# Patient Record
Sex: Female | Born: 1985 | Race: White | Hispanic: No | Marital: Married
Health system: Southern US, Community
[De-identification: ages and names within clinical notes are randomized; demographics above are authoritative.]

## PROBLEM LIST (undated history)

## (undated) DIAGNOSIS — E785 Hyperlipidemia, unspecified: Secondary | ICD-10-CM

## (undated) DIAGNOSIS — I10 Essential (primary) hypertension: Secondary | ICD-10-CM

## (undated) DIAGNOSIS — L719 Rosacea, unspecified: Secondary | ICD-10-CM

## (undated) HISTORY — DX: Hyperlipidemia, unspecified: E78.5

## (undated) HISTORY — DX: Essential (primary) hypertension: I10

## (undated) HISTORY — DX: Rosacea, unspecified: L71.9

---
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
## 2005-04-25 ENCOUNTER — Ambulatory Visit: Payer: Self-pay | Admitting: Family Medicine

## 2005-09-02 ENCOUNTER — Ambulatory Visit: Payer: Self-pay | Admitting: Family Medicine

## 2005-09-24 ENCOUNTER — Ambulatory Visit: Payer: Self-pay | Admitting: Family Medicine

## 2005-10-10 ENCOUNTER — Ambulatory Visit: Payer: Self-pay | Admitting: Family Medicine

## 2005-10-15 ENCOUNTER — Ambulatory Visit: Payer: Self-pay | Admitting: Family Medicine

## 2005-10-22 ENCOUNTER — Ambulatory Visit: Payer: Self-pay | Admitting: Family Medicine

## 2005-11-04 ENCOUNTER — Ambulatory Visit: Payer: Self-pay | Admitting: Family Medicine

## 2005-12-02 ENCOUNTER — Ambulatory Visit: Payer: Self-pay | Admitting: Family Medicine

## 2006-01-07 ENCOUNTER — Ambulatory Visit: Payer: Self-pay | Admitting: *Deleted

## 2006-01-31 ENCOUNTER — Ambulatory Visit: Payer: Self-pay | Admitting: *Deleted

## 2006-02-25 ENCOUNTER — Ambulatory Visit: Payer: Self-pay | Admitting: Family Medicine

## 2006-05-16 HISTORY — PX: TONSILLECTOMY: SUR1361

## 2007-04-06 ENCOUNTER — Ambulatory Visit (HOSPITAL_COMMUNITY): Admission: RE | Admit: 2007-04-06 | Discharge: 2007-04-06 | Payer: Self-pay | Admitting: Neurology

## 2007-04-29 ENCOUNTER — Ambulatory Visit (HOSPITAL_COMMUNITY): Admission: RE | Admit: 2007-04-29 | Discharge: 2007-04-29 | Payer: Self-pay | Admitting: Neurology

## 2010-12-28 ENCOUNTER — Ambulatory Visit (HOSPITAL_COMMUNITY)
Admission: RE | Admit: 2010-12-28 | Discharge: 2010-12-28 | Payer: Self-pay | Source: Home / Self Care | Attending: Obstetrics and Gynecology | Admitting: Obstetrics and Gynecology

## 2011-04-30 NOTE — Procedures (Signed)
EEG NUMBER:  [id number].   HISTORY:  This is a 25 year old with syncope who is having EEG done to  evaluate for seizure activity.   PROCEDURE:  This is a sleep-deprived EEG.   TECHNICAL DESCRIPTION:  Throughout this sleep-deprived EEG, there is a  posterior dominant rhythm of 10 Hz activity at 30-45 microvolts.  The  background activity is symmetric, mostly comprised of alpha range  activity at 35-50 microvolts.  With photic stimulation, there is  symmetric photic driving response.  Hyperventilation produces a mild  symmetric slowing response.  The patient does fall asleep during this  recording.  Throughout the circuit, there is no evidence of  electrographic seizures or interictal discharge activity.  The EKG  tracing shows a heart rate of 55-60 beats per minute.   IMPRESSION:  This routine EEG is within normal limits in the awake and  sleep states.  EKG tracing shows heart rate around 55-60 beats per  minute.      Bevelyn Buckles. Nash Shearer, M.D.  Electronically Signed     [id number]  D:  04/29/2007 12:30:02  T:  04/29/2007 13:25:12  Job #:  [id number]

## 2011-05-03 NOTE — Procedures (Signed)
EEG #[id number]   CLINICAL HISTORY:  This is a sleep-deprived EEG done with photic  stimulation and hyperventilation.  The patient is described as awake and  drowsy.  This is a 25 year old woman with multiple episodes of passing  out.  EEG was performed for evaluation of possible seizure.   DESCRIPTION:  The dominant rhythm of this tracing is a moderate-to-high  amplitude alpha rhythm of 10 Hz which predominates posteriorly, appears  without abnormal asymmetry and attenuates with eye opening and closing.  Low amplitude fast activity is seen frontally and centrally and appears  without abnormal asymmetry.  No focal slowing is noted.  No epileptiform  discharges are seen.  The patient appeared to remain in the awake state  throughout the recording.  Photic stimulation produced symmetric driving  responses.  Hyperventilation produced no significant change in the  background rhythms.  Single channel devoted to EKG revealed sinus rhythm  throughout with a rate of approximately 72 beats per minute.   CONCLUSION:  Normal study in the awake state.  Although this study was  performed with sleep deprivation, the patient remained in the awake  state throughout the recording.      Michael L. Thad Ranger, M.D.  Electronically Signed     [id number]  D:  04/06/2007 20:16:34  T:  04/07/2007 08:30:39  Job #:  [id number]

## 2019-11-15 DIAGNOSIS — I69954 Hemiplegia and hemiparesis following unspecified cerebrovascular disease affecting left non-dominant side: Secondary | ICD-10-CM

## 2019-11-15 DIAGNOSIS — G8194 Hemiplegia, unspecified affecting left nondominant side: Secondary | ICD-10-CM

## 2020-05-19 DIAGNOSIS — G43009 Migraine without aura, not intractable, without status migrainosus: Secondary | ICD-10-CM | POA: Insufficient documentation

## 2020-05-19 DIAGNOSIS — G4709 Other insomnia: Secondary | ICD-10-CM | POA: Insufficient documentation

## 2020-05-19 DIAGNOSIS — E66813 Obesity, class 3: Secondary | ICD-10-CM | POA: Insufficient documentation

## 2020-05-19 DIAGNOSIS — F419 Anxiety disorder, unspecified: Secondary | ICD-10-CM | POA: Insufficient documentation

## 2020-10-26 ENCOUNTER — Emergency Department (HOSPITAL_COMMUNITY)
Admission: EM | Admit: 2020-10-26 | Discharge: 2020-10-26 | Disposition: A | Discharge status: Home / Self Care | Payer: 59 | Attending: Emergency Medicine | Admitting: Emergency Medicine

## 2020-10-26 ENCOUNTER — Emergency Department (HOSPITAL_COMMUNITY): Payer: 59

## 2020-10-26 ENCOUNTER — Other Ambulatory Visit: Payer: Self-pay

## 2020-10-26 ENCOUNTER — Encounter (HOSPITAL_COMMUNITY): Payer: Self-pay

## 2020-10-26 DIAGNOSIS — H469 Unspecified optic neuritis: Secondary | ICD-10-CM | POA: Diagnosis not present

## 2020-10-26 DIAGNOSIS — H5461 Unqualified visual loss, right eye, normal vision left eye: Secondary | ICD-10-CM | POA: Diagnosis not present

## 2020-10-26 DIAGNOSIS — G43019 Migraine without aura, intractable, without status migrainosus: Secondary | ICD-10-CM | POA: Diagnosis not present

## 2020-10-26 DIAGNOSIS — H538 Other visual disturbances: Secondary | ICD-10-CM | POA: Diagnosis present

## 2020-10-26 LAB — BASIC METABOLIC PANEL
Anion gap: 11 (ref 5–15)
BUN: 13 mg/dL (ref 6–20)
CO2: 26 mmol/L (ref 22–32)
Calcium: 9.7 mg/dL (ref 8.9–10.3)
Chloride: 104 mmol/L (ref 98–111)
Creatinine, Ser: 0.79 mg/dL (ref 0.44–1.00)
GFR, Estimated: >60 mL/min (ref >60)
Glucose, Bld: 99 mg/dL (ref 70–99)
Potassium: 3.8 mmol/L (ref 3.5–5.1)
Sodium: 141 mmol/L (ref 135–145)

## 2020-10-26 LAB — I-STAT CHEM 8, ED
BUN: 15 mg/dL (ref 6–20)
Calcium, Ion: 1.18 mmol/L (ref 1.15–1.40)
Chloride: 104 mmol/L (ref 98–111)
Creatinine, Ser: 0.8 mg/dL (ref 0.44–1.00)
Glucose, Bld: 98 mg/dL (ref 70–99)
HCT: 42 % (ref 36.0–46.0)
Hemoglobin: 14.3 g/dL (ref 12.0–15.0)
Potassium: 3.7 mmol/L (ref 3.5–5.1)
Sodium: 143 mmol/L (ref 135–145)
TCO2: 28 mmol/L (ref 22–32)

## 2020-10-26 LAB — CBC
HCT: 43.1 % (ref 36.0–46.0)
Hemoglobin: 14 g/dL (ref 12.0–15.0)
MCH: 29.7 pg (ref 26.0–34.0)
MCHC: 32.5 g/dL (ref 30.0–36.0)
MCV: 91.3 fL (ref 80.0–100.0)
Platelets: 238 10*3/uL (ref 150–400)
RBC: 4.72 MIL/uL (ref 3.87–5.11)
RDW: 13.3 % (ref 11.5–15.5)
WBC: 9.3 10*3/uL (ref 4.0–10.5)
nRBC: 0 % (ref 0.0–0.2)

## 2020-10-26 LAB — I-STAT BETA HCG BLOOD, ED (MC, WL, AP ONLY): I-stat hCG, quantitative: <5 m[IU]/mL (ref <5)

## 2020-10-26 MED ORDER — LORAZEPAM 2 MG/ML IJ SOLN
1.0000 mg | Freq: Once | INTRAMUSCULAR | Status: AC
  Administered 2020-10-26: 1 mg via INTRAVENOUS
  Filled 2020-10-26: qty 1

## 2020-10-26 MED ORDER — GADOBUTROL 1 MMOL/ML IV SOLN
8.0000 mL | Freq: Once | INTRAVENOUS | Status: AC | PRN
  Administered 2020-10-26: 8 mL via INTRAVENOUS

## 2020-10-26 NOTE — ED Notes (Signed)
Pt still in MRI, mother remains at bedside and was updated of same

## 2020-10-26 NOTE — ED Notes (Signed)
MRI called and reports pt is claustrophobic and needs something for same, advised them to bring patient back due to nothing being ordered. Provider messaged regarding same.

## 2020-10-26 NOTE — ED Notes (Signed)
Patient back from MRI.

## 2020-10-26 NOTE — ED Provider Notes (Signed)
[facility] EMERGENCY DEPARTMENT Provider Note   CSN: [id number] Arrival date & time: 10/26/20  1417     History Chief Complaint  Patient presents with  . Visual Field Change    DALAYSIA Wagner is a 34 y.o. female.  HPI      This AM at 86AM had blurred vision right eye Thought it was contact at first [facility] to urgent caRE, went to eye doctor around 930AM Can see out of center but is blurry and faded, darkened, Then on the sides of vision above, below and sides all black.   Can see movement and light but not colors, or shapes/letters  Mild pain slight above it dull ache  No other neuro symptoms Denies numbness, weakness, difficulty talking or walking, other visual changes or facial droop.  No headache  Wears contacts, saw Dr. Ernestine Mcmurray OD at [facility]  MRI head orbits, CBC, ESR, CRP, ACEi Lyme FTA ABS RPR VDRL CXR  20/400, IOP 17 and 19 Macula WNL   Hx of complex migraine in the past and was admitted for stroke work up.    History reviewed. No pertinent past medical history.  There are no problems to display for this patient.   History reviewed. No pertinent surgical history.   OB History   No obstetric history on file.     No family history on file.  Social History   Tobacco Use  . Smoking status: Not on file  Substance Use Topics  . Alcohol use: Not on file  . Drug use: Not on file    Home Medications Prior to Admission medications   Not on File    Allergies    Patient has no allergy information on record.  Review of Systems   Review of Systems  Constitutional: Negative for fever.  HENT: Negative for sore throat.   Eyes: Positive for visual disturbance. Negative for pain (slight pain above eye), discharge and redness.  Respiratory: Negative for cough and shortness of breath.   Cardiovascular: Negative for chest pain.  Gastrointestinal: Negative for abdominal pain, nausea and vomiting.  Genitourinary:  Negative for difficulty urinating.  Musculoskeletal: Negative for back pain and neck pain.  Skin: Negative for rash.  Neurological: Negative for syncope and headaches.    Physical Exam Updated Vital Signs BP 102/67   Pulse 94   Temp 98.3 F (36.8 C) (Oral)   Resp 18   Ht 5' 3"  (1.6 m)   Wt 83.5 kg   SpO2 93%   BMI 32.59 kg/m   Physical Exam Vitals and nursing note reviewed.  Constitutional:      General: She is not in acute distress.    Appearance: Normal appearance. She is well-developed. She is not ill-appearing or diaphoretic.  HENT:     Head: Normocephalic and atraumatic.  Eyes:     General: No visual field deficit.    Extraocular Movements: Extraocular movements intact.     Conjunctiva/sclera: Conjunctivae normal.     Comments: anisocoria  Cardiovascular:     Rate and Rhythm: Normal rate and regular rhythm.     Pulses: Normal pulses.     Heart sounds: Normal heart sounds. No murmur heard.  No friction rub. No gallop.   Pulmonary:     Effort: Pulmonary effort is normal. No respiratory distress.     Breath sounds: Normal breath sounds. No wheezing or rales.  Abdominal:     General: There is no distension.     Palpations: Abdomen  is soft.     Tenderness: There is no abdominal tenderness. There is no guarding.  Musculoskeletal:        General: No swelling or tenderness.     Cervical back: Normal range of motion.  Skin:    General: Skin is warm and dry.     Findings: No erythema or rash.  Neurological:     General: No focal deficit present.     Mental Status: She is alert and oriented to person, place, and time.     GCS: GCS eye subscore is 4. GCS verbal subscore is 5. GCS motor subscore is 6.     Cranial Nerves: No cranial nerve deficit, dysarthria or facial asymmetry.     Sensory: No sensory deficit.     Motor: No weakness or tremor.     Coordination: Coordination normal. Finger-Nose-Finger Test normal.     Gait: Gait normal.     Comments: Pronator drift  left UE, slight LLE drift, slight left facial droop, weakness with flexion and extension of left arm Poor vision all visual fields right eye.  Normal visual fields left eye     ED Results / Procedures / Treatments   Labs (all labs ordered are listed, but only abnormal results are displayed) Labs Reviewed  CBC  BASIC METABOLIC PANEL  I-STAT CHEM 8, ED  CBG MONITORING, ED  I-STAT BETA HCG BLOOD, ED ([facility], [facility], [facility] ONLY)    EKG None  Radiology CT HEAD WO CONTRAST  Result Date: 10/26/2020 CLINICAL DATA:  Monocular vision loss EXAM: CT HEAD WITHOUT CONTRAST TECHNIQUE: Contiguous axial images were obtained from the base of the skull through the vertex without intravenous contrast. COMPARISON:  11/15/2019 FINDINGS: Brain: No acute infarct or hemorrhage. Lateral ventricles and midline structures are unremarkable. No acute extra-axial fluid collections. No mass effect. Vascular: No hyperdense vessel or unexpected calcification. Skull: Normal. Negative for fracture or focal lesion. Sinuses/Orbits: No acute finding. Other: None. IMPRESSION: 1. Stable head CT, no acute process. Electronically Signed   By: Randa Ngo M.D.   On: 10/26/2020 15:48   MR Brain W and Wo Contrast  Result Date: 10/26/2020 CLINICAL DATA:  Optic neuritis. EXAM: MRI HEAD AND ORBITS WITHOUT AND WITH CONTRAST TECHNIQUE: Multiplanar, multiecho pulse sequences of the brain and surrounding structures were obtained without and with intravenous contrast. Multiplanar, multiecho pulse sequences of the orbits and surrounding structures were obtained including fat saturation techniques, before and after intravenous contrast administration. CONTRAST:  73m GADAVIST GADOBUTROL 1 MMOL/ML IV SOLN COMPARISON:  Head CT October 26, 2020. FINDINGS: MRI HEAD FINDINGS Brain: No acute infarction, hemorrhage, hydrocephalus, extra-axial collection or mass lesion. The brain parenchyma has normal morphology and signal characteristics. No abnormalities  involving the optic radiations or occipital lobes. Vascular: Normal flow voids. Skull and upper cervical spine: Enlarged left Meckel's cave, suggesting incidental small meningocele. Visualized sinuses: Clear. Other: None. MRI ORBITS FINDINGS ORBITS: Globes: Normal. Lacrimal glands: Normal. Retrobulbar fat: No mass or inflammatory change. Extraocular muscles: Normal bulk and signal intensity. Optic pathway: Optic nerves, chiasm, and tracts are normal. Cavernous sinuses: Normal. Cranial nerves and brainstem: Normal. No abnormal enhancement. IMPRESSION: Unremarkable MRI of the brain and orbits with and without contrast. Specifically, no evidence of optic neuritis. Electronically Signed   By: KPedro EarlsM.D.   On: 10/26/2020 22:36   MR ORBITS W WO CONTRAST  Result Date: 10/26/2020 CLINICAL DATA:  Optic neuritis. EXAM: MRI HEAD AND ORBITS WITHOUT AND WITH CONTRAST TECHNIQUE: Multiplanar, multiecho pulse  sequences of the brain and surrounding structures were obtained without and with intravenous contrast. Multiplanar, multiecho pulse sequences of the orbits and surrounding structures were obtained including fat saturation techniques, before and after intravenous contrast administration. CONTRAST:  48m GADAVIST GADOBUTROL 1 MMOL/ML IV SOLN COMPARISON:  Head CT October 26, 2020. FINDINGS: MRI HEAD FINDINGS Brain: No acute infarction, hemorrhage, hydrocephalus, extra-axial collection or mass lesion. The brain parenchyma has normal morphology and signal characteristics. No abnormalities involving the optic radiations or occipital lobes. Vascular: Normal flow voids. Skull and upper cervical spine: Enlarged left Meckel's cave, suggesting incidental small meningocele. Visualized sinuses: Clear. Other: None. MRI ORBITS FINDINGS ORBITS: Globes: Normal. Lacrimal glands: Normal. Retrobulbar fat: No mass or inflammatory change. Extraocular muscles: Normal bulk and signal intensity. Optic pathway: Optic  nerves, chiasm, and tracts are normal. Cavernous sinuses: Normal. Cranial nerves and brainstem: Normal. No abnormal enhancement. IMPRESSION: Unremarkable MRI of the brain and orbits with and without contrast. Specifically, no evidence of optic neuritis. Electronically Signed   By: KPedro EarlsM.D.   On: 10/26/2020 22:36    Procedures Procedures (including critical care time)  Medications Ordered in ED Medications  LORazepam (ATIVAN) injection 1 mg (1 mg Intravenous Given 10/26/20 2108)  gadobutrol (GADAVIST) 1 MMOL/ML injection 8 mL (8 mLs Intravenous Contrast Given 10/26/20 2154)    ED Course  I have reviewed the triage vital signs and the nursing notes.  Pertinent labs & imaging results that were available during my care of the patient were reviewed by me and considered in my medical decision making (see chart for details).    MDM Rules/Calculators/A&P                          89yofemale with history of complex migraine presents with concern for painless right vision loss at request of optometrist.  Given left sided weakness on my exam, emergently consulted Neurology to evaluate for possible Code Stroke although no clear sign of visual field loss/aphasia/neglect.  Dr. CTheda Serscame to bedside for evaluation and recommends MRI brain WWO and orbits and discussed possibility of ocular migraine.  Optometrist did not have concerns on exam to suggest CRAO, CRVO.  (also no signs of glaucoma, corneal abrasion).  Called Dr. SManuella Ghaziof Ophthalmology--discussed with patient to call in AM for evaluation. 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   Final Clinical Impression(s) / ED Diagnoses Final diagnoses:  Vision loss, right eye    Rx / DC Orders ED Discharge Orders    None       SGareth Morgan MD 10/27/20 1201

## 2020-10-26 NOTE — ED Triage Notes (Addendum)
Pt arrives to ED w/ c/o blurred vision in R eye that started at 0730 this morning. Pt otherwise neuro intact. Pt sent by eye doctor for eval, concern for optic neuritis per paperwork. Pt has hx of complex migraines w/ associated neuro symptoms, however, pt denies headache today.

## 2020-10-26 NOTE — ED Notes (Signed)
Patient medicated and is now ready for MRI. Remains in MRI bay.

## 2020-10-26 NOTE — ED Notes (Signed)
Patient updated on plan of care, denies issues with MRI in the past.

## 2020-10-26 NOTE — ED Notes (Signed)
ED Provider at bedside. 

## 2020-10-26 NOTE — ED Notes (Addendum)
This RN to MRI to administer meds, but they already scheduled someone in front of patient. Spoke with MRI tech, will keep pt in MRI and scan next. Will call this RN when ready for scan. Transport canceled. Pt updated on same and verbalized understanding of same, she is resting comfortably and denies further needs at this time.

## 2020-10-26 NOTE — Consult Note (Addendum)
Neurology Consult H&P  CC: acute visual loss  History is obtained from: patient, ophthalmology note, mother, Dr. Dalene Seltzer.  HPI: Theresa Wagner is a 34 y.o. female right handed PMHx migraine presenting for acute monocular right visual loss. Onset 0730 with blurred vision to near and far. At first she thought it was her contact at first and she went to caRE and subsequently sent to ophtalmology ~0930 and was sent to ED for imaging of orbits to rule optic neuritis.  Can see out of center but is blurry and faded, darkened along the periphery. Then on the sides of vision above, below and sides all black. She can see movement and light but not colors, or shapes/letters. No pain on eye movement and transient mild supraorbital pain which was dull ache.   No other neuro symptoms denies numbness, weakness, difficulty talking or walking, other visual changes or facial droop.   Last night she felt onset of her typical migraine (left frontotemporal sharp throbbing slowly ramping up) and immediately took her triptan and aborted.  ROS: A complete ROS was performed and is negative except as noted in the HPI.   PMHx migraine  FHx: father, uncles, brother - migraines.  Social History: Denies EtOH, Tob/illicits  Exam: Current vital signs: BP (!) 142/97   Pulse 86   Temp 98.4 F (36.9 C) (Oral)   Resp (!) 24   Ht 5\' 3"  (1.6 m)   Wt 83.5 kg   SpO2 99%   BMI 32.59 kg/m   Physical Exam  Constitutional: Appears well-developed and well-nourished.  Psych: Affect appropriate to situation Eyes: No scleral injection HENT: No OP obstrucion Head: Normocephalic.  Cardiovascular: Normal rate and regular rhythm.  Respiratory: Effort normal and breath sounds normal to anterior ascultation GI: Soft.  No distension. There is no tenderness.  Skin: WDI  Neuro: Mental Status: Patient is awake, alert, oriented to person, place, month, year, and situation. Patient is able to give a clear and coherent  history. No signs of aphasia or neglect. Cranial Nerves: II: Visual Fields OD only to motion, absent color. Pupils OD 6-->66mm/OS 4-->2, round, and reactive to light. Fundus: clear crisp no pallor/cupping. c/d ~3-4 b/l III,IV, VI: EOMI without ptosis or diploplia.  V: Facial sensation is symmetric to temperature VII: Facial movement is symmetric.  VIII: hearing is intact to voice X: Uvula elevates symmetrically XI: Shoulder shrug is symmetric. XII: tongue is midline without atrophy or fasciculations.  Motor: Tone is normal. Bulk is normal. 5/5 strength was present in all four extremities. Sensory: Sensation is symmetric to light touch and temperature in the arms and legs. Deep Tendon Reflexes: 2+ and symmetric in the biceps and patellae. Plantars: Toes are downgoing bilaterally. Cerebellar: FNF and HKS are intact bilaterally.  I have reviewed the images obtained: NCT head showed no acute process.  Assessment: LEYLANY Wagner is a 34 y.o. female PMHx migraine headaches with acute monocular right visual loss with anisocoria.  Anisocoria similar in light and dark with reactive pupils normal EOMs, painless without ptosis and loss of color vision. As retina was normal on fundoscopy, constellation of symptoms suggest posterior chamber to posterior segment of the optic nerve. Differential diagnosis includes occular migraine, optic neuritis/multiple sclerosis.  Plan: - MRI brain wo/w contrast emphasis on orbits. - If suggestive of optic neuritis/multiple sclerosis consider methylprednisolone 1,000mg  daily for 5 days. - Ocular migraine by def is painless and typically resolves without treatment.   Electronically signed by: Dr. 32 Pager: [phone] 10/26/2020,  7:32 PM

## 2020-10-26 NOTE — ED Notes (Signed)
Patient still in MRI, unable to get vitals due to same

## 2022-03-07 IMAGING — MR MR HEAD WO/W CM
24 of 26 series · 42 of 48 positions shown · IV contrast (gadavist)
Comparison: Head CT October 26, 2020.

CLINICAL DATA: Optic neuritis.

EXAM:
MRI HEAD AND ORBITS WITHOUT AND WITH CONTRAST
TECHNIQUE: Multiplanar, multiecho pulse sequences of the brain and surrounding
structures were obtained without and with intravenous contrast.
Multiplanar, multiecho pulse sequences of the orbits and surrounding
structures were obtained including fat saturation techniques, before
and after intravenous contrast administration.
CONTRAST:  8mL GADAVIST GADOBUTROL 1 MMOL/ML IV SOLN

[Series 9: DWI · axial · 3.0mm · 0.88mm/px · z∈[-133,+13]mm · 6 of 100 slices shown (1 of 4)]
[im 1/100]
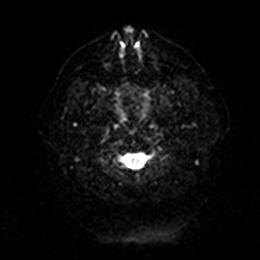
[im 20/100]
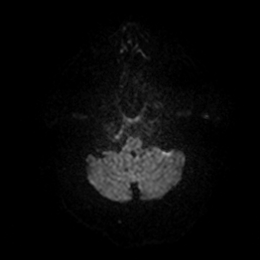
[im 40/100]
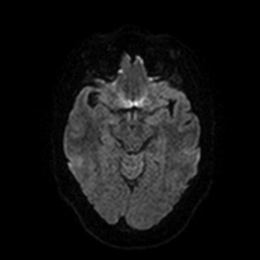
[im 60/100]
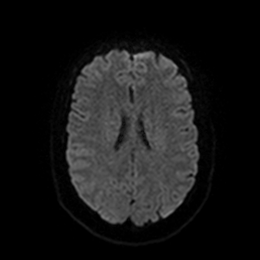
[im 80/100]
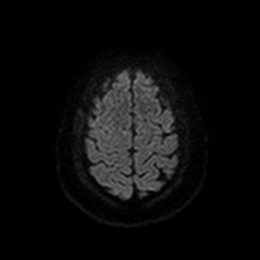
[im 100/100]
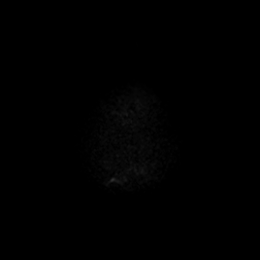

[Series 10: DWI · axial · 3.0mm · 0.88mm/px · z∈[-133,+13]mm · 3 of 50 slices shown (2 of 4)]
[im 1/50]
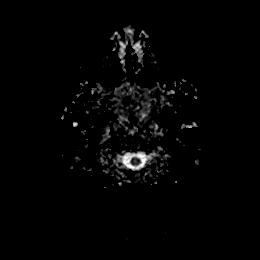
[im 25/50]
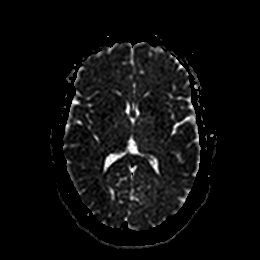
[im 50/50]
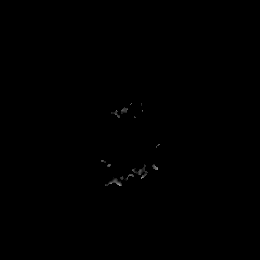

[Series 11: DWI · coronal · 4.0mm · 0.88mm/px · 4 of 72 slices shown (3 of 4)]
[im 1/72]
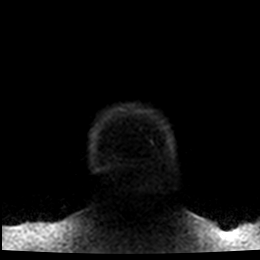
[im 24/72]
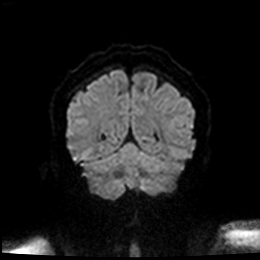
[im 48/72]
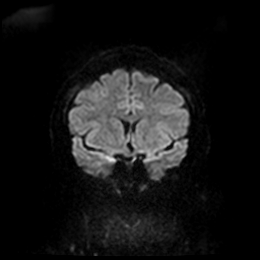
[im 72/72]
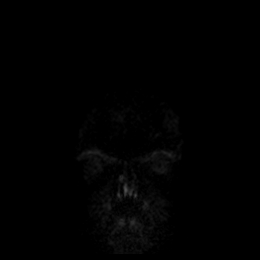

[Series 12: DWI · coronal · 4.0mm · 0.88mm/px · 2 of 36 slices shown (4 of 4)]
[im 1/36]
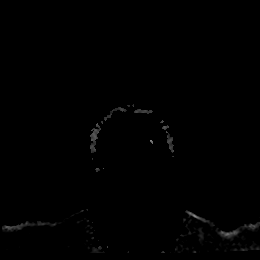
[im 36/36]
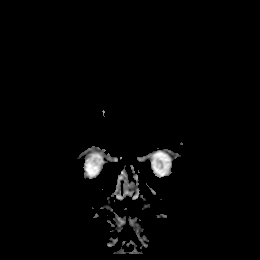

[Series 13: FLAIR · axial · 5.0mm · 0.45mm/px · 1 of 25 slices shown]
[im 1/25]
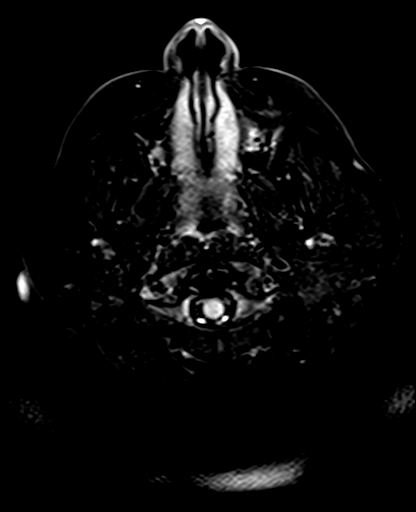

[Series 14: T1 · sagittal · 5.0mm · 0.75mm/px · 1 of 23 slices shown (1 of 3)]
[im 1/23]
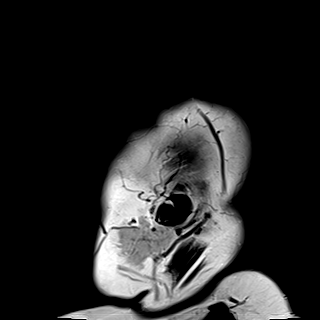

[Series 15: T2 · axial · 5.0mm · 0.72mm/px · 1 of 25 slices shown]
[im 1/25]
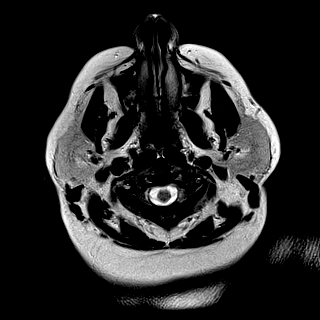

[Series 16: mag_images · axial · 3.0mm · 0.90mm/px · z∈[-143,+10]mm · 3 of 52 slices shown]
[im 1/52]
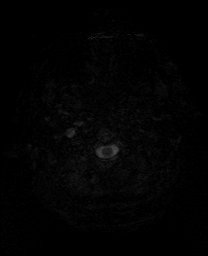
[im 26/52]
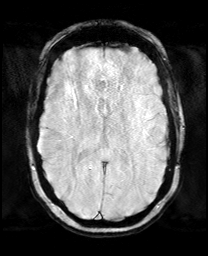
[im 52/52]
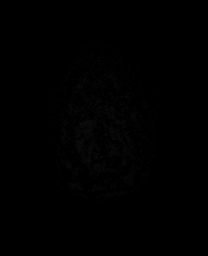

[Series 17: pha_images · axial · 3.0mm · 0.90mm/px · z∈[-143,+10]mm · 3 of 52 slices shown]
[im 1/52]
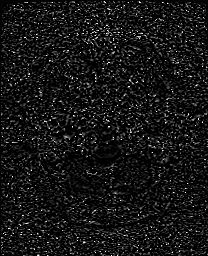
[im 26/52]
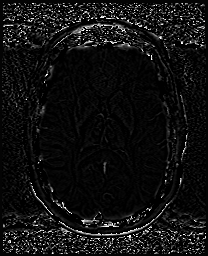
[im 52/52]
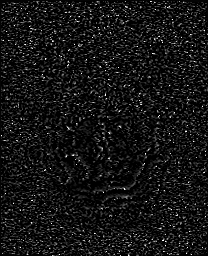

[Series 18: swi_images · axial · 3.0mm · 0.90mm/px · z∈[-143,+10]mm · 3 of 52 slices shown]
[im 1/52]
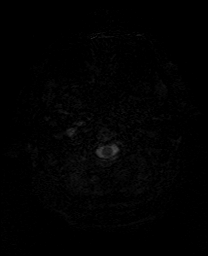
[im 26/52]
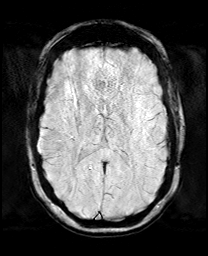
[im 52/52]
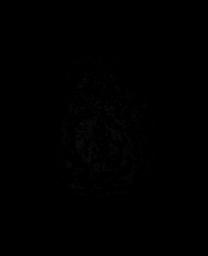

[Series 19: mip_images(sw) · axial · 24.0mm · 0.90mm/px · z∈[-132,-0]mm · 2 of 45 slices shown]
[im 1/45]
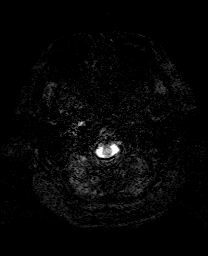
[im 45/45]
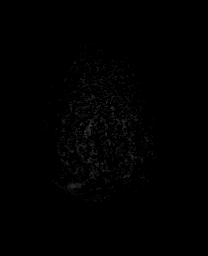

[Series 21: T1 · axial · non-contrast · 3.0mm · 0.37mm/px · 1 of 15 slices shown (2 of 3)]
[im 1/15]
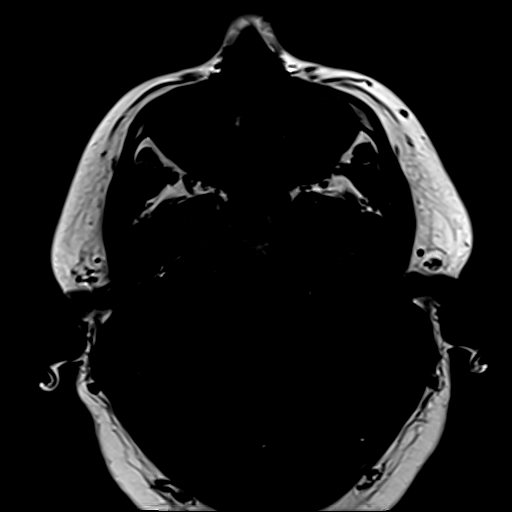

[Series 22: T2 fat-sat · axial · 3.0mm · 0.54mm/px · 1 of 15 slices shown (1 of 6)]
[im 1/15]
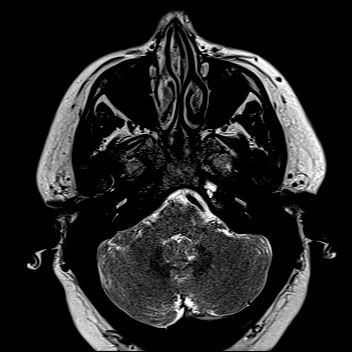

[Series 23: T2 fat-sat · axial · 3.0mm · 0.54mm/px · 1 of 15 slices shown (2 of 6)]
[im 1/15]
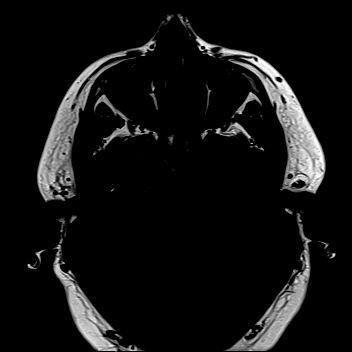

[Series 24: T2 fat-sat · axial · 3.0mm · 0.54mm/px · 1 of 15 slices shown (3 of 6)]
[im 1/15]
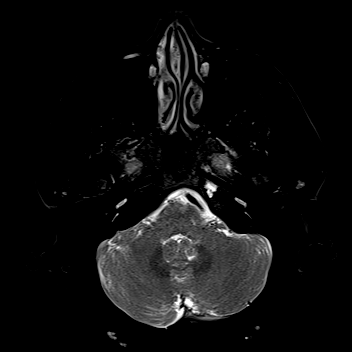

[Series 25: T2 fat-sat · coronal · 3.0mm · 0.54mm/px · 1 of 28 slices shown (4 of 6)]
[im 1/28]
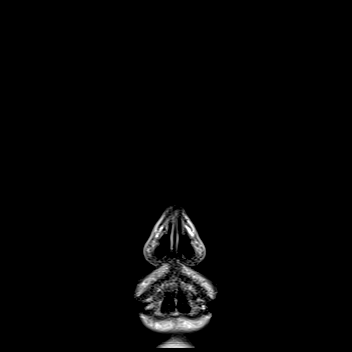

[Series 26: T2 fat-sat · coronal · 3.0mm · 0.54mm/px · 1 of 28 slices shown (5 of 6)]
[im 1/28]
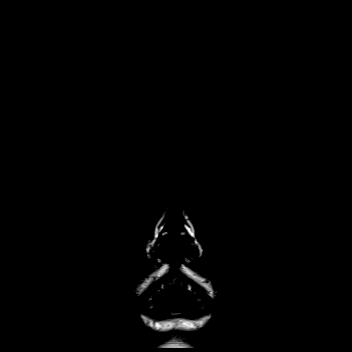

[Series 27: T2 fat-sat · coronal · 3.0mm · 0.54mm/px · 1 of 28 slices shown (6 of 6)]
[im 1/28]
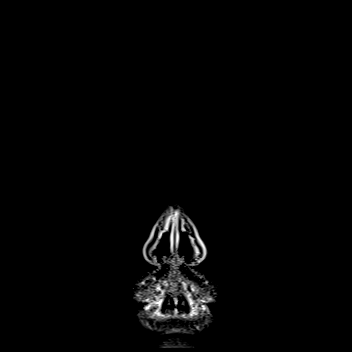

[Series 28: T1 · coronal · 3.0mm · 0.37mm/px · 1 of 28 slices shown (3 of 3)]
[im 1/28]
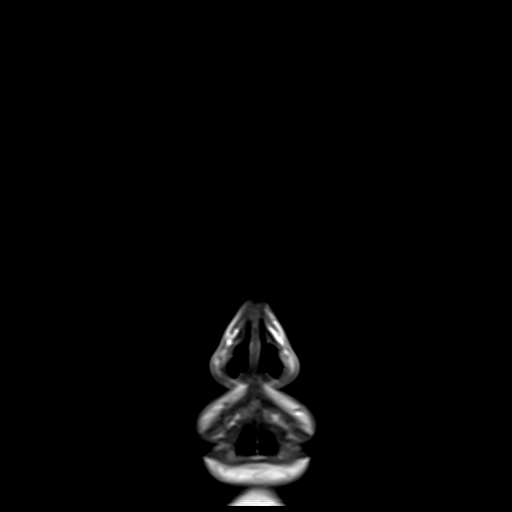

[Series 29: T1 fat-sat post-contrast · axial · 3.0mm · 0.37mm/px · 1 of 15 slices shown (1 of 2)]
[im 1/15]
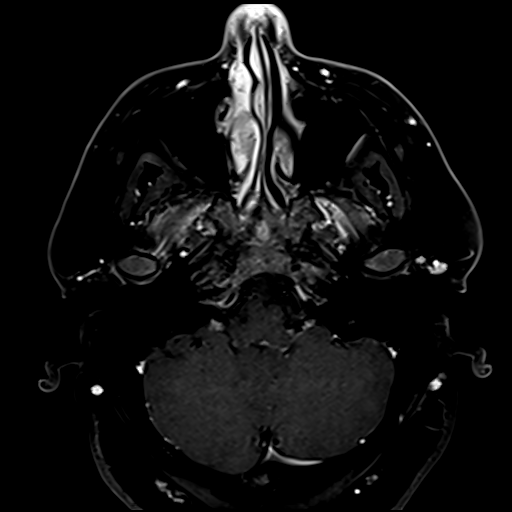

[Series 30: T1 fat-sat post-contrast · coronal · 3.0mm · 0.37mm/px · 1 of 28 slices shown (2 of 2)]
[im 1/28]
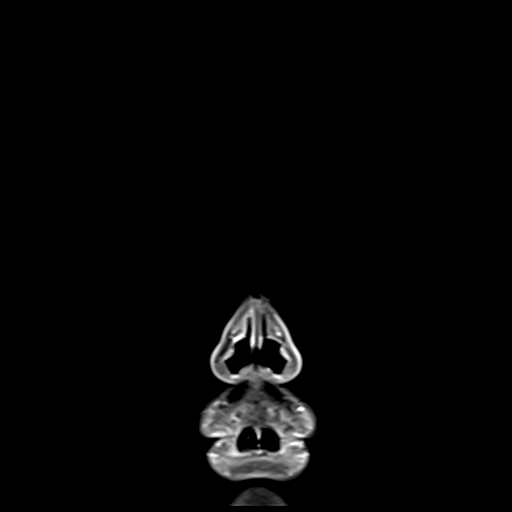

[Series 31: T2 post-contrast · coronal · 5.0mm · 0.72mm/px · 1 of 29 slices shown]
[im 1/29]
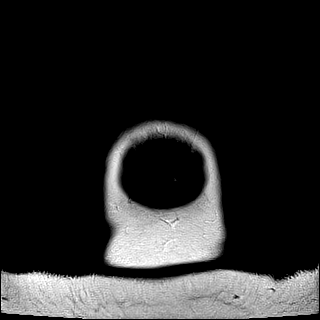

[Series 33: T1 post-contrast · coronal · 5.0mm · 0.34mm/px · 1 of 29 slices shown (1 of 2)]
[im 1/29]
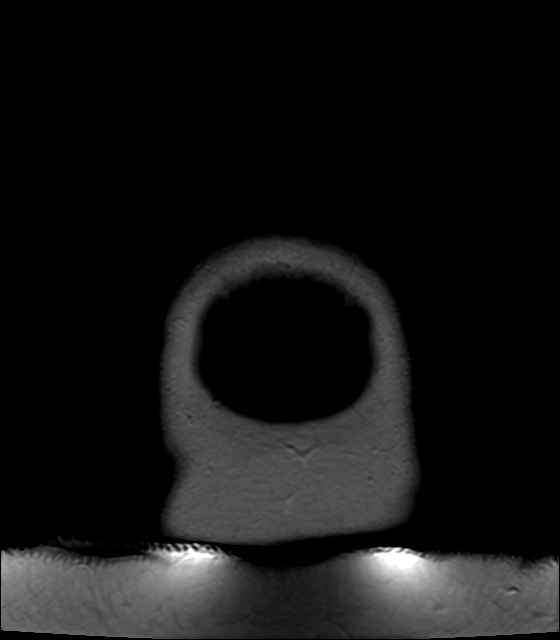

[Series 34: T1 post-contrast · sagittal · 5.0mm · 0.72mm/px · 1 of 23 slices shown (2 of 2)]
[im 1/23]
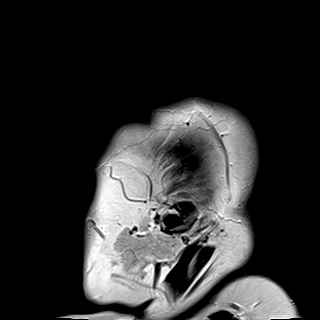

[42 of 48 positions shown; findings below may reference images not displayed]

FINDINGS: MRI HEAD FINDINGS

Brain: No acute infarction, hemorrhage, hydrocephalus, extra-axial
collection or mass lesion. The brain parenchyma has normal
morphology and signal characteristics. No abnormalities involving
the optic radiations or occipital lobes.

Vascular: Normal flow voids.

Skull and upper cervical spine: Enlarged left Meckel's cave,
suggesting incidental small meningocele.

Visualized sinuses: Clear.

Other: None.

MRI ORBITS FINDINGS

ORBITS:

Globes: Normal.

Lacrimal glands: Normal.

Retrobulbar fat: No mass or inflammatory change.

Extraocular muscles: Normal bulk and signal intensity.

Optic pathway: Optic nerves, chiasm, and tracts are normal.

Cavernous sinuses: Normal.

Cranial nerves and brainstem: Normal. No abnormal enhancement.
IMPRESSION: Unremarkable MRI of the brain and orbits with and without contrast.
Specifically, no evidence of optic neuritis.

## 2024-04-19 ENCOUNTER — Ambulatory Visit (INDEPENDENT_AMBULATORY_CARE_PROVIDER_SITE_OTHER)

## 2024-04-19 VITALS — BP 128/86 | HR 76 | Temp 97.8°F | Ht 63.0 in | Wt 275.8 lb

## 2024-04-19 DIAGNOSIS — Z1159 Encounter for screening for other viral diseases: Secondary | ICD-10-CM | POA: Diagnosis not present

## 2024-04-19 DIAGNOSIS — E785 Hyperlipidemia, unspecified: Secondary | ICD-10-CM | POA: Insufficient documentation

## 2024-04-19 DIAGNOSIS — E66813 Obesity, class 3: Secondary | ICD-10-CM | POA: Insufficient documentation

## 2024-04-19 DIAGNOSIS — I10 Essential (primary) hypertension: Secondary | ICD-10-CM | POA: Diagnosis not present

## 2024-04-19 DIAGNOSIS — L719 Rosacea, unspecified: Secondary | ICD-10-CM | POA: Diagnosis not present

## 2024-04-19 MED ORDER — SEMAGLUTIDE-WEIGHT MANAGEMENT 0.25 MG/0.5ML ~~LOC~~ SOAJ: 0.2500 mg | SUBCUTANEOUS | 1 refills | Status: DC

## 2024-04-19 NOTE — Patient Instructions (Signed)
  VISIT SUMMARY: Today, we discussed your weight management journey and explored options to help you continue losing weight. We also reviewed your current medications and overall health status.  YOUR PLAN: OBESITY: You have been unable to lose weight despite dietary changes and regular exercise. -We will submit prior authorization for semaglutide injections for weight loss. -Increase exercise to 40 minutes five times a week or 30 minutes of Peloton and 30 minutes of another activity on non-working days. -Aim for a target heart rate of 137 during exercise. -Try to achieve 10,000 steps daily. -Consider Wellbutrin if semaglutide is not covered by insurance.  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 -No acute issues at this time.  HYPERTENSION: Your blood pressure is well-controlled with your current medication. -Continue taking lisinopril and hydrochlorothiazide as prescribed.  HYPERLIPIDEMIA: Your cholesterol levels were previously elevated. -Continue taking atorvastatin as prescribed. -We will review your new blood work results once available.  ANXIETY: Your anxiety is manageable without medication. -No medication needed at this time.  ROSACEA: Your rosacea is managed with daily doxycycline. -Continue taking doxycycline as prescribed.  WELLNESS VISIT: Routine wellness visit to discuss your health status, medications, and lifestyle. -Perform blood work to assess current health status. -Review results of blood work and adjust medications if necessary. -Scheduled for a Pap smear in June.

## 2024-04-19 NOTE — Assessment & Plan Note (Signed)
 Hyperlipidemia managed with atorvastatin. Previous blood work indicated elevated cholesterol levels. Awaiting review of new blood work results.

## 2024-04-19 NOTE — Assessment & Plan Note (Signed)
 Rosacea managed with daily doxycycline. No issues with sun sensitivity.

## 2024-04-19 NOTE — Progress Notes (Signed)
 Subjective:  Patient ID: Theresa Wagner, female    DOB: 1986-04-22  Age: 38 y.o. MRN: [id number]  Chief Complaint  Patient presents with   Establish Care    HPI:  Patient is establishing as a new patient. Discussed the use of AI scribe software for clinical note transcription with the patient, who gave verbal consent to proceed.  History of Present Illness   Discussed the use of AI scribe software for clinical note transcription with the patient, who gave verbal consent to proceed.  History of Present Illness   Theresa Wagner is a 38 year old female who presents for new patient visit, to establish care and also to discuss weight management.  She has a history of hypertension, managed with lisinopril and hydrochlorothiazide 20/25 mg once daily. Her blood pressure was elevated in her last blood work in October of the previous year, but her blood sugars were normal. She also has hyperlipidemia, for which she takes atorvastatin 20 mg daily.  She has been on a significant weight loss journey, having previously weighed close to 300 pounds and now fluctuating between 269 and 275 pounds. Despite dietary changes and family support, she feels 'stuck' at her current weight. She uses MyFitnessPal to track her diet, consuming approximately 1250 calories daily, and acknowledges occasional binge eating and stress snacking. She recently acquired a Peloton bike and exercises 30 minutes a day, five times a week, along with strength training two to three times weekly.  She has a history of rosacea, managed with daily doxycycline, and does not experience sun sensitivity. She denies any allergies.  Her family history includes type 2 diabetes in her mother and type 1 diabetes in her grandmother. She denies any personal history of gestational diabetes, thyroid cancer, or elevated blood sugars. She has been informed of having non-alcoholic fatty liver disease by a previous provider.  She denies smoking and  alcohol use. She works as a paralegal, often sitting for long periods, but has recently started using a sit-to-stand desk. She has an IUD (Mirena) inserted in March 2022 for birth control.  No chronic headaches, migraines, sleep apnea, or kidney stones. She experiences anxiety occasionally but does not take medication for it. No history of depression.          04/19/2024    9:38 AM  Depression screen PHQ 2/9  Decreased Interest 0  Down, Depressed, Hopeless 0  PHQ - 2 Score 0         10/26/2020    2:30 PM 10/26/2020    8:44 PM 04/19/2024    9:38 AM  Fall Risk  Falls in the past year?   0  Was there an injury with Fall?   0  Fall Risk Category Calculator   0  (RETIRED) Patient Fall Risk Level Low fall risk Low fall risk   Patient at Risk for Falls Due to   No Fall Risks     Current Outpatient Medications on File Prior to Visit  Medication Sig Dispense Refill   atorvastatin (LIPITOR) 20 MG tablet Take 20 mg by mouth daily.     doxycycline (MONODOX) 50 MG capsule Take 50 mg by mouth daily. roscea     levonorgestrel (MIRENA) 20 MCG/DAY IUD 1 each by Intrauterine route once. Inserted MARCH 2022     lisinopril-hydrochlorothiazide (ZESTORETIC) 20-25 MG tablet Take 1 tablet by mouth daily.     No current facility-administered medications on file prior to visit.  . Social History   Socioeconomic  History   Marital status: Married    Spouse name: Ciria Kerner   Number of children: 2   Years of education: Not on file   Highest education level: Associate degree: occupational, Scientist, product/process development, or vocational program  Occupational History   Not on file  Tobacco Use   Smoking status: Never   Smokeless tobacco: Never  Vaping Use   Vaping status: Never Used  Substance and Sexual Activity   Alcohol use: Never   Drug use: Never   Sexual activity: Yes    Partners: Male    Birth control/protection: I.U.D.  Other Topics Concern   Not on file  Social History Narrative   Not on file   Social  Drivers of Health   Financial Resource Strain: Low Risk  (04/18/2024)   Overall Financial Resource Strain (CARDIA)    Difficulty of Paying Living Expenses: Not very hard  Food Insecurity: No Food Insecurity (04/18/2024)   Hunger Vital Sign    Worried About Running Out of Food in the Last Year: Never true    Ran Out of Food in the Last Year: Never true  Transportation Needs: No Transportation Needs (04/18/2024)   PRAPARE - Administrator, Civil Service (Medical): No    Lack of Transportation (Non-Medical): No  Physical Activity: Insufficiently Active (04/18/2024)   Exercise Vital Sign    Days of Exercise per Week: 3 days    Minutes of Exercise per Session: 30 min  Stress: Stress Concern Present (04/18/2024)   [employer] of Occupational Health - Occupational Stress Questionnaire    Feeling of Stress : To some extent  Social Connections: Socially Integrated (04/18/2024)   Social Connection and Isolation Panel [NHANES]    Frequency of Communication with Friends and Family: More than three times a week    Frequency of Social Gatherings with Friends and Family: Once a week    Attends Religious Services: More than 4 times per year    Active Member of [employer] or Organizations: Yes    Attends Engineer, structural: More than 4 times per year    Marital Status: Married   Past Medical History:  Diagnosis Date   Hyperlipidemia    Hypertension 03/2023   Rosacea    Family History  Problem Relation Age of Onset   Diabetes Mother    Obesity Mother    Diabetes Maternal Grandmother    Obesity Sister    Obesity Brother     Review of Systems  Constitutional:  Negative for chills, fatigue and fever.  HENT:  Negative for congestion, ear pain, sinus pressure and sore throat.   Respiratory:  Negative for cough and shortness of breath.   Cardiovascular:  Negative for chest pain.  Gastrointestinal:  Negative for abdominal pain, constipation, diarrhea, nausea and vomiting.   Genitourinary:  Negative for dysuria and frequency.  Musculoskeletal:  Negative for arthralgias, back pain and myalgias.  Neurological:  Negative for dizziness and headaches.  Psychiatric/Behavioral:  Negative for dysphoric mood. The patient is not nervous/anxious.      Objective:  BP 128/86   Pulse 76   Temp 97.8 F (36.6 C)   Ht 5\' 3"  (1.6 m)   Wt 275 lb 12.8 oz (125.1 kg)   SpO2 98%   BMI 48.86 kg/m      04/19/2024    9:34 AM 10/26/2020   11:30 PM 10/26/2020   11:00 PM  BP/Weight  Systolic BP 128 102 116  Diastolic BP 86 67 46  Wt. (  Lbs) 275.8    BMI 48.86 kg/m2      Physical Exam Vitals and nursing note reviewed.  Constitutional:      Appearance: She is obese.  HENT:     Head: Normocephalic and atraumatic.  Cardiovascular:     Rate and Rhythm: Normal rate and regular rhythm.  Pulmonary:     Effort: Pulmonary effort is normal.     Breath sounds: Normal breath sounds.  Musculoskeletal:        General: Normal range of motion.  Skin:    General: Skin is warm.  Neurological:     General: No focal deficit present.     Mental Status: She is alert and oriented to person, place, and time.  Psychiatric:        Mood and Affect: Mood normal.        Behavior: Behavior normal.    Diabetic Foot Exam - Simple   No data filed      Lab Results  Component Value Date   WBC 9.3 10/26/2020   HGB 14.3 10/26/2020   HCT 42.0 10/26/2020   PLT 238 10/26/2020   GLUCOSE 98 10/26/2020   NA 143 10/26/2020   K 3.7 10/26/2020   CL 104 10/26/2020   CREATININE 0.80 10/26/2020   BUN 15 10/26/2020   CO2 26 10/26/2020      Assessment & Plan:  Hypertension, unspecified type Assessment & Plan: Hypertension managed with lisinopril and hydrochlorothiazide. Blood pressure is well-controlled.  Orders: -     CBC with Differential/Platelet -     Comprehensive metabolic panel with GFR  Need for hepatitis C screening test -     Hepatitis C antibody  Hyperlipidemia,  unspecified hyperlipidemia type Assessment & Plan: Hyperlipidemia managed with atorvastatin. Previous blood work indicated elevated cholesterol levels. Awaiting review of new blood work results.  Orders: -     Lipid panel  Rosacea Assessment & Plan: Rosacea managed with daily doxycycline. No issues with sun sensitivity.   Class 3 severe obesity due to excess calories without serious comorbidity in adult Assessment & Plan: Obesity with a BMI of 48. She has been unable to lose weight despite dietary changes and regular exercise. Interested in medical management for weight loss. Discussed the importance of lifestyle modifications and the potential use of weight loss medications, specifically semaglutide injections, including potential side effects such as stomach upset, nausea, and vomiting. Emphasized a comprehensive approach including diet, exercise, and medication. Target weight loss of 4-6 pounds per month, focusing on losing 1-2 pounds per week. - Submit prior authorization for semaglutide injections for weight loss. - Encourage increasing exercise to 40 minutes five times a week or 30 minutes of Peloton and 30 minutes of another activity on non-working days. - Set a target heart rate of 137 during exercise. - Encourage achieving 10,000 steps daily. - Consider Wellbutrin if semaglutide is not covered by insurance.  Nonalcoholic steatohepatitis (NASH) Nonalcoholic steatohepatitis, discussed as a potential indication for weight loss medication coverage. No acute issues.  Orders: -     T4, free -     TSH  Other orders -     Semaglutide-Weight Management; Inject 0.25 mg into the skin once a week.  Dispense: 2 mL; Refill: 1     Assessment and Plan           Meds ordered this encounter  Medications   Semaglutide-Weight Management 0.25 MG/0.5ML SOAJ    Sig: Inject 0.25 mg into the skin once a week.  Dispense:  2 mL    Refill:  1    Class 3 BMI of 48 and Non alcoholic  steatohepatitis   Orders Placed This Encounter  Procedures   CBC with Differential   Comprehensive metabolic panel with GFR   Lipid Panel   Hepatitis C antibody   T4, free   TSH      Follow-up: Return in about 8 weeks (around 06/14/2024) for weight management.  AVS was given to patient prior to departure.  Loistine Rinne [facility] [phone]

## 2024-04-19 NOTE — Assessment & Plan Note (Signed)
 Hypertension managed with lisinopril and hydrochlorothiazide. Blood pressure is well-controlled.

## 2024-04-19 NOTE — Assessment & Plan Note (Signed)
 Obesity with a BMI of 48. She has been unable to lose weight despite dietary changes and regular exercise. Interested in medical management for weight loss. Discussed the importance of lifestyle modifications and the potential use of weight loss medications, specifically semaglutide injections, including potential side effects such as stomach upset, nausea, and vomiting. Emphasized a comprehensive approach including diet, exercise, and medication. Target weight loss of 4-6 pounds per month, focusing on losing 1-2 pounds per week. - Submit prior authorization for semaglutide injections for weight loss. - Encourage increasing exercise to 40 minutes five times a week or 30 minutes of Peloton and 30 minutes of another activity on non-working days. - Set a target heart rate of 137 during exercise. - Encourage achieving 10,000 steps daily. - Consider Wellbutrin if semaglutide is not covered by insurance.  Nonalcoholic steatohepatitis (NASH) Nonalcoholic steatohepatitis, discussed as a potential indication for weight loss medication coverage. No acute issues.

## 2024-04-20 ENCOUNTER — Other Ambulatory Visit: Payer: Self-pay

## 2024-04-20 LAB — COMPREHENSIVE METABOLIC PANEL WITH GFR
ALT: 33 IU/L — ABNORMAL HIGH (ref 0–32)
AST: 19 IU/L (ref 0–40)
Albumin: 4.1 g/dL (ref 3.9–4.9)
Alkaline Phosphatase: 91 IU/L (ref 44–121)
BUN/Creatinine Ratio: 16 (ref 9–23)
BUN: 12 mg/dL (ref 6–20)
Bilirubin Total: 0.3 mg/dL (ref 0.0–1.2)
CO2: 18 mmol/L — ABNORMAL LOW (ref 20–29)
Calcium: 9.3 mg/dL (ref 8.7–10.2)
Chloride: 104 mmol/L (ref 96–106)
Creatinine, Ser: 0.74 mg/dL (ref 0.57–1.00)
Globulin, Total: 2.6 g/dL (ref 1.5–4.5)
Glucose: 77 mg/dL (ref 70–99)
Potassium: 4.2 mmol/L (ref 3.5–5.2)
Sodium: 140 mmol/L (ref 134–144)
Total Protein: 6.7 g/dL (ref 6.0–8.5)
eGFR: 107 mL/min/{1.73_m2} (ref >59)

## 2024-04-20 LAB — CBC WITH DIFFERENTIAL/PLATELET
Basophils Absolute: 0.1 10*3/uL (ref 0.0–0.2)
Basos: 1 %
EOS (ABSOLUTE): 0.2 10*3/uL (ref 0.0–0.4)
Eos: 2 %
Hematocrit: 41.4 % (ref 34.0–46.6)
Hemoglobin: 13.5 g/dL (ref 11.1–15.9)
Immature Grans (Abs): 0 10*3/uL (ref 0.0–0.1)
Immature Granulocytes: 0 %
Lymphocytes Absolute: 2.1 10*3/uL (ref 0.7–3.1)
Lymphs: 25 %
MCH: 30.3 pg (ref 26.6–33.0)
MCHC: 32.6 g/dL (ref 31.5–35.7)
MCV: 93 fL (ref 79–97)
Monocytes Absolute: 0.6 10*3/uL (ref 0.1–0.9)
Monocytes: 7 %
Neutrophils Absolute: 5.5 10*3/uL (ref 1.4–7.0)
Neutrophils: 65 %
Platelets: 211 10*3/uL (ref 150–450)
RBC: 4.45 x10E6/uL (ref 3.77–5.28)
RDW: 13.4 % (ref 11.7–15.4)
WBC: 8.4 10*3/uL (ref 3.4–10.8)

## 2024-04-20 LAB — LIPID PANEL
Chol/HDL Ratio: 4.8 ratio — ABNORMAL HIGH (ref 0.0–4.4)
Cholesterol, Total: 202 mg/dL — ABNORMAL HIGH (ref 100–199)
HDL: 42 mg/dL (ref >39)
LDL Chol Calc (NIH): 135 mg/dL — ABNORMAL HIGH (ref 0–99)
Triglycerides: 138 mg/dL (ref 0–149)
VLDL Cholesterol Cal: 25 mg/dL (ref 5–40)

## 2024-04-20 LAB — TSH: TSH: 2.08 u[IU]/mL (ref 0.450–4.500)

## 2024-04-20 LAB — T4, FREE: Free T4: 1.02 ng/dL (ref 0.82–1.77)

## 2024-04-20 LAB — HEPATITIS C ANTIBODY: Hep C Virus Ab: NONREACTIVE

## 2024-04-23 ENCOUNTER — Other Ambulatory Visit: Payer: Self-pay

## 2024-06-14 ENCOUNTER — Ambulatory Visit

## 2024-06-25 ENCOUNTER — Ambulatory Visit (INDEPENDENT_AMBULATORY_CARE_PROVIDER_SITE_OTHER)

## 2024-06-25 VITALS — BP 116/72 | HR 91 | Temp 98.2°F | Ht 63.0 in | Wt 263.0 lb

## 2024-06-25 DIAGNOSIS — E785 Hyperlipidemia, unspecified: Secondary | ICD-10-CM | POA: Diagnosis not present

## 2024-06-25 DIAGNOSIS — I10 Essential (primary) hypertension: Secondary | ICD-10-CM

## 2024-06-25 DIAGNOSIS — Z6841 Body Mass Index (BMI) 40.0 and over, adult: Secondary | ICD-10-CM | POA: Insufficient documentation

## 2024-06-25 DIAGNOSIS — L719 Rosacea, unspecified: Secondary | ICD-10-CM

## 2024-06-25 DIAGNOSIS — E66813 Obesity, class 3: Secondary | ICD-10-CM

## 2024-06-25 DIAGNOSIS — Z7689 Persons encountering health services in other specified circumstances: Secondary | ICD-10-CM | POA: Insufficient documentation

## 2024-06-25 MED ORDER — DOXYCYCLINE MONOHYDRATE 50 MG PO CAPS: 50.0000 mg | ORAL_CAPSULE | Freq: Every day | ORAL | 1 refills | Status: DC

## 2024-06-25 MED ORDER — LISINOPRIL-HYDROCHLOROTHIAZIDE 20-25 MG PO TABS: 1.0000 | ORAL_TABLET | Freq: Every day | ORAL | 1 refills | Status: DC

## 2024-06-25 MED ORDER — ATORVASTATIN CALCIUM 20 MG PO TABS: 20.0000 mg | ORAL_TABLET | Freq: Every day | ORAL | 1 refills | Status: DC

## 2024-06-25 NOTE — Assessment & Plan Note (Signed)
 Class III obesity with initial weight 275.8 pounds, BMI 48.8. Weight reduced to 263 pounds after three weeks on Wegovy  and lifestyle changes, indicating a 15-pound loss. Reports improved physical and mental well-being. Emphasized gradual weight loss of 1-2 pounds per week to avoid metabolic compensation and maintain weight loss by adjusting the body's fat set point. - Continue Wegovy  0.25 mg with gradual dose increase as tolerated to minimize side effects - Encourage continuation of lifestyle changes, including 45 minutes of exercise five days a week and dietary modifications - Add strength training to exercise regimen - Encourage increased water intake

## 2024-06-25 NOTE — Assessment & Plan Note (Signed)
 Hypertension well-controlled with lisinopril -hydrochlorothiazide  20/25 mg. Potential to reduce antihypertensive medication dosage with continued weight loss. Blood pressure remains stable. - Refill lisinopril -hydrochlorothiazide  20/25 mg - Consider reducing antihypertensive medication dosage if blood pressure remains stable with weight loss

## 2024-06-25 NOTE — Progress Notes (Signed)
 Subjective:  Patient ID: Theresa Wagner, female    DOB: 1986/04/11  Age: 38 y.o. MRN: [id number]  Chief Complaint  Patient presents with   Medical Management of Chronic Issues    Weight Management    HPI:   Patient presents for weight management follow up. Reports no issues other than mild nausea which resolves. Discussed the use of AI scribe software for clinical note transcription with the patient, who gave verbal consent to proceed.  History of Present Illness   The patient presents for follow-up on weight management and medication review.  Obesity and weight management - Initiated Wegovy  (semaglutide ) three weeks ago at a dose of 0.25 mg weekly - Experienced mild nausea after the first injection, no side effects with the second injection - Weight decreased from 276 pounds to 263 pounds since May 5th, a total loss of approximately 15 pounds - Engages in regular exercise: 45 minutes per session, five days per week, using an exercise bike or walking path - Implements dietary modifications including meal prepping and substituting healthier options such as sweet potatoes for [place] fries - Perceives improvement in physical and mental well-being since starting these interventions  Hypertension - Takes lisinopril -hydrochlorothiazide  20/25 mg daily for blood pressure control  Hyperlipidemia - Takes atorvastatin  20 mg daily for cholesterol management  Rosacea - Takes doxycycline  50 mg daily for approximately six months without interruption - Rosacea primarily affects the forehead and cheeks - No current visible lesions due to makeup - Sun exposure does not exacerbate symptoms         04/19/2024    9:38 AM  Depression screen PHQ 2/9  Decreased Interest 0  Down, Depressed, Hopeless 0  PHQ - 2 Score 0        06/25/2024    8:49 AM  Fall Risk   Falls in the past year? 0  Number falls in past yr: 0  Injury with Fall? 0  Risk for fall due to : No Fall Risks  Follow up Falls  evaluation completed    Patient Care Team: Kymere Fullington, MD as PCP - General (Family Medicine)   Review of Systems  Constitutional: Negative.   HENT: Negative.    Eyes: Negative.   Respiratory: Negative.    Gastrointestinal:  Positive for nausea.  Endocrine: Negative.   Genitourinary: Negative.   Skin:  Positive for rash.  Neurological: Negative.     Current Outpatient Medications on File Prior to Visit  Medication Sig Dispense Refill   levonorgestrel (MIRENA) 20 MCG/DAY IUD 1 each by Intrauterine route once. Inserted MARCH 2022     Semaglutide -Weight Management 0.25 MG/0.5ML SOAJ Inject 0.25 mg into the skin once a week. 2 mL 1   No current facility-administered medications on file prior to visit.   Past Medical History:  Diagnosis Date   Hyperlipidemia    Hypertension 03/2023   Rosacea    Past Surgical History:  Procedure Laterality Date   TONSILLECTOMY  05/16/2006    Family History  Problem Relation Age of Onset   Diabetes Mother    Obesity Mother    Diabetes Maternal Grandmother    Obesity Sister    Obesity Brother    Social History   Socioeconomic History   Marital status: Married    Spouse name: Marijose Curington   Number of children: 2   Years of education: Not on file   Highest education level: Associate degree: occupational, Scientist, product/process development, or vocational program  Occupational History   Not on file  Tobacco Use   Smoking status: Never   Smokeless tobacco: Never  Vaping Use   Vaping status: Never Used  Substance and Sexual Activity   Alcohol use: Never   Drug use: Never   Sexual activity: Yes    Partners: Male    Birth control/protection: I.U.D.  Other Topics Concern   Not on file  Social History Narrative   Not on file   Social Drivers of Health   Financial Resource Strain: Low Risk  (06/24/2024)   Overall Financial Resource Strain (CARDIA)    Difficulty of Paying Living Expenses: Not hard at all  Food Insecurity: No Food Insecurity  (06/24/2024)   Hunger Vital Sign    Worried About Running Out of Food in the Last Year: Never true    Ran Out of Food in the Last Year: Never true  Transportation Needs: No Transportation Needs (06/24/2024)   PRAPARE - Administrator, Civil Service (Medical): No    Lack of Transportation (Non-Medical): No  Physical Activity: Sufficiently Active (06/24/2024)   Exercise Vital Sign    Days of Exercise per Week: 5 days    Minutes of Exercise per Session: 30 min  Recent Concern: Physical Activity - Insufficiently Active (04/18/2024)   Exercise Vital Sign    Days of Exercise per Week: 3 days    Minutes of Exercise per Session: 30 min  Stress: No Stress Concern Present (06/24/2024)   [employer] of Occupational Health - Occupational Stress Questionnaire    Feeling of Stress: Not at all  Recent Concern: Stress - Stress Concern Present (04/18/2024)   [employer] of Occupational Health - Occupational Stress Questionnaire    Feeling of Stress : To some extent  Social Connections: Socially Integrated (06/24/2024)   Social Connection and Isolation Panel    Frequency of Communication with Friends and Family: More than three times a week    Frequency of Social Gatherings with Friends and Family: Once a week    Attends Religious Services: More than 4 times per year    Active Member of [employer] or Organizations: Yes    Attends Engineer, structural: More than 4 times per year    Marital Status: Married    Objective:  BP 116/72   Pulse 91   Temp 98.2 F (36.8 C)   Ht 5' 3 (1.6 m)   Wt 263 lb (119.3 kg)   LMP  (LMP Unknown)   SpO2 97%   BMI 46.59 kg/m      06/25/2024    8:47 AM 04/19/2024    9:34 AM 10/26/2020   11:30 PM  BP/Weight  Systolic BP 116 128 102  Diastolic BP 72 86 67  Wt. (Lbs) 263 275.8   BMI 46.59 kg/m2 48.86 kg/m2     Physical Exam Vitals and nursing note reviewed.  Constitutional:      Appearance: She is obese.  HENT:     Head: Normocephalic  and atraumatic.  Cardiovascular:     Rate and Rhythm: Normal rate and regular rhythm.  Pulmonary:     Effort: Pulmonary effort is normal.     Breath sounds: Normal breath sounds.  Musculoskeletal:        General: Normal range of motion.  Skin:    Comments: Reports rosacea on face, no rash noted due to facial make up  Neurological:     General: No focal deficit present.     Mental Status: She is alert.  Psychiatric:  Mood and Affect: Mood normal.         Lab Results  Component Value Date   WBC 8.4 04/19/2024   HGB 13.5 04/19/2024   HCT 41.4 04/19/2024   PLT 211 04/19/2024   GLUCOSE 77 04/19/2024   CHOL 202 (H) 04/19/2024   TRIG 138 04/19/2024   HDL 42 04/19/2024   LDLCALC 135 (H) 04/19/2024   ALT 33 (H) 04/19/2024   AST 19 04/19/2024   NA 140 04/19/2024   K 4.2 04/19/2024   CL 104 04/19/2024   CREATININE 0.74 04/19/2024   BUN 12 04/19/2024   CO2 18 (L) 04/19/2024   TSH 2.080 04/19/2024      Assessment & Plan:  Hypertension, unspecified type Assessment & Plan: Hypertension well-controlled with lisinopril -hydrochlorothiazide  20/25 mg. Potential to reduce antihypertensive medication dosage with continued weight loss. Blood pressure remains stable. - Refill lisinopril -hydrochlorothiazide  20/25 mg - Consider reducing antihypertensive medication dosage if blood pressure remains stable with weight loss   Class 3 severe obesity with serious comorbidity and body mass index (BMI) of 45.0 to 49.9 in adult Assessment & Plan: Class III obesity with initial weight 275.8 pounds, BMI 48.8. Weight reduced to 263 pounds after three weeks on Wegovy  and lifestyle changes, indicating a 15-pound loss. Reports improved physical and mental well-being. Emphasized gradual weight loss of 1-2 pounds per week to avoid metabolic compensation and maintain weight loss by adjusting the body's fat set point. - Continue Wegovy  0.25 mg with gradual dose increase as tolerated to minimize side  effects - Encourage continuation of lifestyle changes, including 45 minutes of exercise five days a week and dietary modifications - Add strength training to exercise regimen - Encourage increased water intake   Hyperlipidemia LDL goal <130 Assessment & Plan: On atorvastatin  20 mg daily for hyperlipidemia. Recent blood work showed slightly elevated cholesterol levels, not significantly concerning. - Refill atorvastatin  20 mg daily   Rosacea Assessment & Plan: On doxycycline  50 mg daily for rosacea for six months. Covers facial redness with makeup, primarily affecting forehead and cheeks. Discussed possibility of drug holiday, not yet implemented. - Refill doxycycline  50 mg daily    Other orders -     Atorvastatin  Calcium ; Take 1 tablet (20 mg total) by mouth daily.  Dispense: 90 tablet; Refill: 1 -     Doxycycline  Monohydrate; Take 1 capsule (50 mg total) by mouth daily. roscea  Dispense: 90 capsule; Refill: 1 -     Lisinopril -hydroCHLOROthiazide ; Take 1 tablet by mouth daily.  Dispense: 90 tablet; Refill: 1    Assessment and Plan           Meds ordered this encounter  Medications   atorvastatin  (LIPITOR) 20 MG tablet    Sig: Take 1 tablet (20 mg total) by mouth daily.    Dispense:  90 tablet    Refill:  1   doxycycline  (MONODOX ) 50 MG capsule    Sig: Take 1 capsule (50 mg total) by mouth daily. roscea    Dispense:  90 capsule    Refill:  1   lisinopril -hydrochlorothiazide  (ZESTORETIC ) 20-25 MG tablet    Sig: Take 1 tablet by mouth daily.    Dispense:  90 tablet    Refill:  1    No orders of the defined types were placed in this encounter.    Follow-up: Return in about 8 weeks (around 08/20/2024) for weight management.    An After Visit Summary was printed and given to the patient.  Laray Corbit, MD [facility] [phone]

## 2024-06-25 NOTE — Assessment & Plan Note (Signed)
 On doxycycline  50 mg daily for rosacea for six months. Covers facial redness with makeup, primarily affecting forehead and cheeks. Discussed possibility of drug holiday, not yet implemented. - Refill doxycycline  50 mg daily

## 2024-06-25 NOTE — Assessment & Plan Note (Signed)
 On atorvastatin  20 mg daily for hyperlipidemia. Recent blood work showed slightly elevated cholesterol levels, not significantly concerning. - Refill atorvastatin  20 mg daily

## 2024-06-25 NOTE — Assessment & Plan Note (Signed)
>>  ASSESSMENT AND PLAN FOR CLASS 3 SEVERE OBESITY WITH SERIOUS COMORBIDITY AND BODY MASS INDEX (BMI) OF 45.0 TO 49.9 IN ADULT WRITTEN ON 06/25/2024  9:09 AM BY Dawsyn Ramsaran, MD  Class III obesity with initial weight 275.8 pounds, BMI 48.8. Weight reduced to 263 pounds after three weeks on Wegovy  and lifestyle changes, indicating a 15-pound loss. Reports improved physical and mental well-being. Emphasized gradual weight loss of 1-2 pounds per week to avoid metabolic compensation and maintain weight loss by adjusting the body's fat set point. - Continue Wegovy  0.25 mg with gradual dose increase as tolerated to minimize side effects - Encourage continuation of lifestyle changes, including 45 minutes of exercise five days a week and dietary modifications - Add strength training to exercise regimen - Encourage increased water intake

## 2024-06-25 NOTE — Patient Instructions (Signed)
 VISIT SUMMARY:  You came in for a follow-up on weight management and medication review. You have lost 15 pounds since starting Wegovy  and making lifestyle changes. Your blood pressure and cholesterol are well-controlled with your current medications, and your rosacea is managed with doxycycline .  YOUR PLAN:  OBESITY AND WEIGHT MANAGEMENT: You have lost 15 pounds since starting Wegovy  and making lifestyle changes, including regular exercise and dietary modifications. -Continue Wegovy  0.25 mg with gradual dose increase as tolerated to minimize side effects. -Keep up with your current exercise routine of 45 minutes, five days a week, and add strength training. -Continue with your dietary modifications and increase your water intake.  HYPERTENSION: Your blood pressure is well-controlled with lisinopril -hydrochlorothiazide  20/25 mg daily. -Refill lisinopril -hydrochlorothiazide  20/25 mg. -We may consider reducing your medication dosage if your blood pressure remains stable with continued weight loss.  HYPERLIPIDEMIA: Your cholesterol levels are slightly elevated but not significantly concerning. -Refill atorvastatin  20 mg daily.  ROSACEA: Your rosacea is managed with doxycycline  50 mg daily, and you cover facial redness with makeup. -Refill doxycycline  50 mg daily.

## 2024-07-18 ENCOUNTER — Other Ambulatory Visit: Payer: Self-pay

## 2024-08-19 ENCOUNTER — Ambulatory Visit (INDEPENDENT_AMBULATORY_CARE_PROVIDER_SITE_OTHER)

## 2024-08-19 ENCOUNTER — Other Ambulatory Visit: Payer: Self-pay

## 2024-08-19 VITALS — BP 118/80 | HR 80 | Temp 97.2°F | Resp 16 | Ht 63.0 in | Wt 263.0 lb

## 2024-08-19 DIAGNOSIS — I10 Essential (primary) hypertension: Secondary | ICD-10-CM

## 2024-08-19 DIAGNOSIS — Z6841 Body Mass Index (BMI) 40.0 and over, adult: Secondary | ICD-10-CM | POA: Diagnosis not present

## 2024-08-19 DIAGNOSIS — E785 Hyperlipidemia, unspecified: Secondary | ICD-10-CM

## 2024-08-19 DIAGNOSIS — Z7689 Persons encountering health services in other specified circumstances: Secondary | ICD-10-CM

## 2024-08-19 DIAGNOSIS — E66813 Obesity, class 3: Secondary | ICD-10-CM | POA: Diagnosis not present

## 2024-08-19 MED ORDER — LISINOPRIL-HYDROCHLOROTHIAZIDE 20-25 MG PO TABS: 1.0000 | ORAL_TABLET | Freq: Every day | ORAL | 1 refills | Status: AC

## 2024-08-19 MED ORDER — WEGOVY 0.5 MG/0.5ML ~~LOC~~ SOAJ: 0.5000 mg | SUBCUTANEOUS | 0 refills | Status: DC

## 2024-08-19 MED ORDER — WEGOVY 0.25 MG/0.5ML ~~LOC~~ SOAJ: 0.5000 mg | SUBCUTANEOUS | 0 refills | Status: DC

## 2024-08-19 NOTE — Progress Notes (Signed)
 Subjective:  Patient ID: Theresa Wagner, female    DOB: [birth year]  Age: 38 y.o. MRN: [id number]  Chief Complaint  Patient presents with   Weight Loss    HPI: Discussed the use of AI scribe software for clinical note transcription with the patient, who gave verbal consent to proceed.  Discussed the use of AI scribe software for clinical note transcription with the patient, who gave verbal consent to proceed.  History of Present Illness   Theresa Wagner is a 38 year old female who presents for follow-up on weight management and medication adjustment.  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, leading to more frequent snacking  Pharmacologic therapy for obesity - On Wegovy  0.25 mg since May, dose not yet increased - No significant side effects except for increased hair loss, etiology uncertain  Hyperlipidemia management - Taking atorvastatin  20 mg daily for cholesterol management - No issues with insurance coverage for medications  Blood pressure monitoring - Not monitoring blood pressure at home due to a broken machine  Preventive health and immunizations - Not currently taking any vitamins - Never received a flu vaccine            04/19/2024    9:38 AM  Depression screen PHQ 2/9  Decreased Interest 0  Down, Depressed, Hopeless 0  PHQ - 2 Score 0        06/25/2024    8:49 AM  Fall Risk   Falls in the past year? 0  Number falls in past yr: 0  Injury with Fall? 0  Risk for fall due to :  No Fall Risks  Follow up Falls evaluation completed    Patient Care Team: Zyden Suman, MD as PCP - General (Family Medicine)   Review of Systems  Constitutional:  Negative for chills, fatigue and fever.  HENT:  Negative for congestion, ear pain and sore throat.   Respiratory:  Negative for cough and shortness of breath.   Cardiovascular:  Negative for chest pain and palpitations.  Gastrointestinal:  Negative for abdominal pain, constipation, diarrhea, nausea and vomiting.  Endocrine: Negative for polydipsia, polyphagia and polyuria.  Genitourinary:  Negative for difficulty urinating and dysuria.  Musculoskeletal:  Negative for arthralgias, back pain and myalgias.  Skin:  Negative for rash.  Neurological:  Negative for headaches.  Psychiatric/Behavioral:  Negative for dysphoric mood. The patient is not nervous/anxious.     Current Outpatient Medications on File Prior to Visit  Medication Sig Dispense Refill   atorvastatin  (LIPITOR) 20 MG tablet Take 1 tablet (20 mg total) by mouth daily. 90 tablet 1   doxycycline  (MONODOX ) 50 MG capsule Take 1 capsule (50 mg total) by mouth daily. roscea 90 capsule 1   levonorgestrel (MIRENA) 20 MCG/DAY IUD 1 each by Intrauterine route once. Inserted MARCH 2022     metroNIDAZOLE (METROCREAM) 0.75 % cream Apply 1 Application topically 2 (two) times daily.     No current facility-administered medications on file prior to visit.   Past Medical History:  Diagnosis Date   Hyperlipidemia    Hypertension 03/2023   Rosacea    Past Surgical History:  Procedure Laterality Date   TONSILLECTOMY  05/16/2006    Family History  Problem Relation Age of Onset   Diabetes Mother    Obesity Mother    Diabetes Maternal Grandmother    Obesity Sister    Obesity Brother    Social History   Socioeconomic History   Marital status: Married    Spouse name: Chantella Creech   Number of children: 2   Years of education: Not on file   Highest education level:  Associate degree: occupational, Scientist, product/process development, or vocational program  Occupational History   Not on file  Tobacco Use   Smoking status: Never   Smokeless tobacco: Never  Vaping Use   Vaping status: Never Used  Substance and Sexual Activity   Alcohol use: Never   Drug use: Never   Sexual activity: Yes    Partners: Male    Birth control/protection: I.U.D.  Other Topics Concern   Not on file  Social History Narrative   Not on file   Social Drivers of Health   Financial Resource Strain: Low Risk  (06/24/2024)   Overall Financial Resource Strain (CARDIA)    Difficulty of Paying Living Expenses: Not hard at all  Food Insecurity: No Food Insecurity (06/24/2024)   Hunger Vital Sign    Worried About Running Out of Food in the Last Year: Never true    Ran Out of Food in the Last Year: Never true  Transportation Needs: No Transportation Needs (06/24/2024)   PRAPARE - Administrator, Civil Service (Medical): No    Lack of Transportation (Non-Medical): No  Physical Activity: Sufficiently Active (06/24/2024)   Exercise Vital Sign    Days of Exercise per Week: 5 days    Minutes of Exercise per Session: 30 min  Recent Concern: Physical Activity - Insufficiently Active (04/18/2024)   Exercise Vital Sign    Days of Exercise per Week: 3 days    Minutes of Exercise per Session: 30 min  Stress: No Stress Concern Present (06/24/2024)   [employer] of Occupational Health - Occupational Stress Questionnaire    Feeling of Stress: Not at all  Recent Concern: Stress - Stress Concern Present (04/18/2024)   [employer] of Occupational Health - Occupational Stress Questionnaire    Feeling of Stress : To some extent  Social Connections: Socially Integrated (06/24/2024)   Social Connection and Isolation Panel    Frequency of Communication with Friends and Family: More than three times a week    Frequency of Social Gatherings with Friends and Family: Once a week    Attends Religious  Services: More than 4 times per year    Active Member of [employer] or Organizations: Yes    Attends Engineer, structural: More than 4 times per year    Marital Status: Married    Objective:  BP 118/80   Pulse 80   Temp (!) 97.2 F (36.2 C)   Resp 16   Ht 5' 3 (1.6 m)   Wt 263 lb (119.3 kg)   LMP  (LMP Unknown)   SpO2 96%   BMI 46.59 kg/m      08/19/2024    3:02 PM 06/25/2024    8:47 AM 04/19/2024    9:34 AM  BP/Weight  Systolic BP 118 116 128  Diastolic BP 80 72 86  Wt. (Lbs) 263 263 275.8  BMI 46.59 kg/m2  46.59 kg/m2 48.86 kg/m2    Physical Exam Vitals and nursing note reviewed.  Constitutional:      Appearance: She is obese.  HENT:     Head: Normocephalic and atraumatic.  Cardiovascular:     Rate and Rhythm: Normal rate and regular rhythm.  Pulmonary:     Effort: Pulmonary effort is normal.     Breath sounds: Normal breath sounds.  Musculoskeletal:        General: Normal range of motion.  Skin:    General: Skin is warm.  Neurological:     General: No focal deficit present.     Mental Status: She is alert.  Psychiatric:        Mood and Affect: Mood normal.         Lab Results  Component Value Date   WBC 8.4 04/19/2024   HGB 13.5 04/19/2024   HCT 41.4 04/19/2024   PLT 211 04/19/2024   GLUCOSE 77 04/19/2024   CHOL 202 (H) 04/19/2024   TRIG 138 04/19/2024   HDL 42 04/19/2024   LDLCALC 135 (H) 04/19/2024   ALT 33 (H) 04/19/2024   AST 19 04/19/2024   NA 140 04/19/2024   K 4.2 04/19/2024   CL 104 04/19/2024   CREATININE 0.74 04/19/2024   BUN 12 04/19/2024   CO2 18 (L) 04/19/2024   TSH 2.080 04/19/2024      Assessment & Plan:  Encounter for weight management Assessment & Plan: As noted below.    Class 3 severe obesity with serious comorbidity and body mass index (BMI) of 45.0 to 49.9 in adult Assessment & Plan: Obesity, class 3  WITH BMI 46.59 WITH COMORBIDITIES OF HYPERTENSION AND HYPERLIPIDEMIA with a recent plateau in weight loss.  Previously reduced weight from 275 lbs to 263 lbs with semaglutide  (Wegovy ) 0.25 mg weekly, diet, and exercise. Current weight has plateaued, possibly due to increased stress and snacking. No significant side effects from semaglutide , though she reports increased hair loss, which may not be related to the medication. - Increase Wegovy  dose to 0.5 mg weekly for four weeks, then increase to 1 mg weekly. - Emphasize comprehensive approach: continue 1250 calorie diet, use My Fitness Pal, and exercise 30-40 minutes five times a week with a target heart rate of 137. - Encourage healthier snacking options, use of low sugar gum, and increased water intake to manage hunger. - Discuss importance of maintaining a consistent exercise schedule, especially with the onset of colder months.   Hyperlipidemia LDL goal <130 Assessment & Plan: Hyperlipidemia, currently managed with atorvastatin  20 mg daily. - Continue atorvastatin  20 mg daily.   Hypertension, unspecified type Assessment & Plan: Essential hypertension. Recent blood pressure reading was 118/80 mmHg. She has not been monitoring blood pressure at home due to a broken machine. - Refill blood pressure medication- lisinopril  20- hydrochlorothiazide  25 mg daily. - Advise obtaining a new home blood pressure monitor to continue regular monitoring.   Other orders -     Lisinopril -hydroCHLOROthiazide ; Take 1 tablet by mouth daily.  Dispense: 90 tablet; Refill: 1 -     Wegovy ; Inject 0.5 mg into the skin once a week.  Dispense: 2 mL; Refill: 0          General Health Maintenance Discussed the importance of flu vaccination, which she declined. She is due for a Pap smear early next year. - Consider flu vaccination. - Schedule Pap smear for early next year.         Meds ordered this encounter  Medications   DISCONTD: semaglutide -weight management (WEGOVY ) 0.25 MG/0.5ML SOAJ SQ injection    Sig: Inject 0.5 mg into the skin once a week.     Dispense:  4 mL    Refill:  0    Dose increased to 0.5 mL weekly for 4 weeks   lisinopril -hydrochlorothiazide  (ZESTORETIC ) 20-25 MG tablet    Sig: Take 1 tablet by mouth daily.    Dispense:  90 tablet    Refill:  1   semaglutide -weight management (WEGOVY ) 0.5 MG/0.5ML SOAJ SQ injection    Sig: Inject 0.5 mg into the skin once a week.    Dispense:  2 mL    Refill:  0    DOSE INCREASED TO 0.5 MG    No orders of the defined types were placed in this encounter.    Follow-up: Return in about 8 weeks (around 10/14/2024) for weight management.    An After Visit Summary was printed and given to the patient.  Ebone Alcivar, MD [facility] [phone]

## 2024-08-19 NOTE — Patient Instructions (Signed)
  VISIT SUMMARY: Today, we discussed your weight management progress, medication adjustments, and general health maintenance. We addressed your recent weight plateau, blood pressure monitoring, and cholesterol management.  YOUR PLAN: OBESITY, CLASS 3: You have been following a weight management plan but have recently experienced a plateau in your weight loss. -Increase Wegovy  dose to 0.5 mg weekly for four weeks, then increase to 1 mg weekly. -Continue your 1250 calorie diet, use My Fitness Pal, and exercise 30-40 minutes five times a week with a target heart rate of 137. -Choose healthier snacks, use low sugar gum, and drink more water to manage hunger. -Maintain a consistent exercise schedule, especially with the onset of colder months.  ESSENTIAL HYPERTENSION: Your recent blood pressure reading was 118/80 mmHg, but you have not been monitoring it at home due to a broken machine. -Refill your blood pressure medication. -Get a new home blood pressure monitor to continue regular monitoring.  HYPERLIPIDEMIA: Your cholesterol is being managed with atorvastatin  20 mg daily. -Continue taking atorvastatin  20 mg daily.  GENERAL HEALTH MAINTENANCE: We discussed the importance of flu vaccination and your upcoming Pap smear. -Consider getting a flu vaccination. -Schedule your Pap smear for early next year.                      Contains text generated by [employer]bridge.                                 Contains text generated by Abridge.

## 2024-08-20 ENCOUNTER — Other Ambulatory Visit: Payer: Self-pay

## 2024-08-23 NOTE — Assessment & Plan Note (Signed)
As noted below

## 2024-08-23 NOTE — Assessment & Plan Note (Signed)
 Hyperlipidemia, currently managed with atorvastatin  20 mg daily. - Continue atorvastatin  20 mg daily.

## 2024-08-23 NOTE — Assessment & Plan Note (Signed)
 Essential hypertension. Recent blood pressure reading was 118/80 mmHg. She has not been monitoring blood pressure at home due to a broken machine. - Refill blood pressure medication- lisinopril  20- hydrochlorothiazide  25 mg daily. - Advise obtaining a new home blood pressure monitor to continue regular monitoring.

## 2024-08-23 NOTE — Assessment & Plan Note (Signed)
 Obesity, class 3  WITH BMI 46.59 WITH COMORBIDITIES OF HYPERTENSION AND HYPERLIPIDEMIA with a recent plateau in weight loss. Previously reduced weight from 275 lbs to 263 lbs with semaglutide  (Wegovy ) 0.25 mg weekly, diet, and exercise. Current weight has plateaued, possibly due to increased stress and snacking. No significant side effects from semaglutide , though she reports increased hair loss, which may not be related to the medication. - Increase Wegovy  dose to 0.5 mg weekly for four weeks, then increase to 1 mg weekly. - Emphasize comprehensive approach: continue 1250 calorie diet, use My Fitness Pal, and exercise 30-40 minutes five times a week with a target heart rate of 137. - Encourage healthier snacking options, use of low sugar gum, and increased water intake to manage hunger. - Discuss importance of maintaining a consistent exercise schedule, especially with the onset of colder months.

## 2024-09-21 ENCOUNTER — Other Ambulatory Visit: Payer: Self-pay

## 2024-09-21 DIAGNOSIS — Z6841 Body Mass Index (BMI) 40.0 and over, adult: Secondary | ICD-10-CM

## 2024-09-21 MED ORDER — SEMAGLUTIDE-WEIGHT MANAGEMENT 1 MG/0.5ML ~~LOC~~ SOAJ: 1.0000 mg | SUBCUTANEOUS | 0 refills | Status: DC

## 2024-09-21 NOTE — Addendum Note (Signed)
 Addended by: Mandee Pluta L on: 09/21/2024 08:15 AM   Modules accepted: Orders

## 2024-10-21 ENCOUNTER — Ambulatory Visit

## 2025-01-04 ENCOUNTER — Encounter (HOSPITAL_BASED_OUTPATIENT_CLINIC_OR_DEPARTMENT_OTHER): Payer: Self-pay | Admitting: Emergency Medicine

## 2025-01-04 ENCOUNTER — Ambulatory Visit (HOSPITAL_BASED_OUTPATIENT_CLINIC_OR_DEPARTMENT_OTHER)
Admission: EM | Admit: 2025-01-04 | Discharge: 2025-01-04 | Disposition: A | Discharge status: Home / Self Care | Attending: Family Medicine | Admitting: Family Medicine

## 2025-01-04 DIAGNOSIS — H9201 Otalgia, right ear: Secondary | ICD-10-CM | POA: Diagnosis not present

## 2025-01-04 DIAGNOSIS — J310 Chronic rhinitis: Secondary | ICD-10-CM | POA: Diagnosis not present

## 2025-01-04 DIAGNOSIS — K047 Periapical abscess without sinus: Secondary | ICD-10-CM | POA: Diagnosis not present

## 2025-01-04 MED ORDER — CETIRIZINE HCL 10 MG PO TABS: 10.0000 mg | ORAL_TABLET | Freq: Every day | ORAL | 0 refills | Status: AC | PRN

## 2025-01-04 MED ORDER — AMOXICILLIN-POT CLAVULANATE 875-125 MG PO TABS: 1.0000 | ORAL_TABLET | Freq: Two times a day (BID) | ORAL | 0 refills | Status: AC

## 2025-01-04 NOTE — ED Provider Notes (Addendum)
 " [facility]    CSN: [id number] Arrival date & time: 01/04/25  1721      History   Chief Complaint Chief Complaint  Patient presents with   Cough   Nasal Congestion    HPI Theresa Wagner is a 39 y.o. female.   39 year old female with complaint of runny nose and dry cough since approximately 12/16/2024.  She has tried Mucinex and now she is taking Vicks.  She has developed some intermittent dizziness and right ear pain since approximately 12/29/2024.  During the exam several really bad looking teeth were discovered.  She has not seen a dentist in a couple years.   Cough Associated symptoms: ear pain and rhinorrhea   Associated symptoms: no chest pain, no chills, no fever, no rash, no shortness of breath and no sore throat     Past Medical History:  Diagnosis Date   Hyperlipidemia    Hypertension 03/2023   Rosacea     Patient Active Problem List   Diagnosis Date Noted   Encounter for weight management 06/25/2024   Hyperlipidemia LDL goal <130    Hypertension    Rosacea    Anxiety 05/19/2020   Migraine without aura and without status migrainosus, not intractable 05/19/2020   Other insomnia 05/19/2020    Past Surgical History:  Procedure Laterality Date   TONSILLECTOMY  05/16/2006    OB History   No obstetric history on file.      Home Medications    Prior to Admission medications  Medication Sig Start Date End Date Taking? Authorizing Provider  amoxicillin -clavulanate (AUGMENTIN ) 875-125 MG tablet Take 1 tablet by mouth 2 (two) times daily after a meal for 10 days. 01/04/25 01/14/25 Yes Ival Domino, FNP  cetirizine  (ZYRTEC ) 10 MG tablet Take 1 tablet (10 mg total) by mouth daily as needed for rhinitis (or nasal congestion). 01/04/25  Yes Ival Domino, FNP  levonorgestrel (MIRENA) 20 MCG/DAY IUD 1 each by Intrauterine route once. Inserted MARCH 2022   Yes [provider]  lisinopril -hydrochlorothiazide  (ZESTORETIC ) 20-25 MG tablet Take 1  tablet by mouth daily. 08/19/24  Yes Sirivol, Mamatha, MD    Family History Family History  Problem Relation Age of Onset   Diabetes Mother    Obesity Mother    Diabetes Maternal Grandmother    Obesity Sister    Obesity Brother     Social History Social History[1]   Allergies   Patient has no known allergies.   Review of Systems Review of Systems  Constitutional:  Negative for chills and fever.  HENT:  Positive for congestion, dental problem, ear pain, postnasal drip and rhinorrhea. Negative for sore throat.   Eyes:  Negative for pain and visual disturbance.  Respiratory:  Positive for cough. Negative for shortness of breath.   Cardiovascular:  Negative for chest pain and palpitations.  Gastrointestinal:  Negative for abdominal pain, constipation, diarrhea, nausea and vomiting.  Genitourinary:  Negative for dysuria and hematuria.  Musculoskeletal:  Negative for arthralgias and back pain.  Skin:  Negative for color change and rash.  Neurological:  Positive for dizziness. Negative for seizures and syncope.  All other systems reviewed and are negative.    Physical Exam Triage Vital Signs ED Triage Vitals  Encounter Vitals Group     BP 01/04/25 1815 (!) 142/86     Girls Systolic BP Percentile --      Girls Diastolic BP Percentile --      Boys Systolic BP Percentile --  Boys Diastolic BP Percentile --      Pulse Rate 01/04/25 1815 79     Resp 01/04/25 1815 18     Temp 01/04/25 1815 98.3 F (36.8 C)     Temp Source 01/04/25 1815 Oral     SpO2 01/04/25 1815 98 %     Weight --      Height --      Head Circumference --      Peak Flow --      Pain Score 01/04/25 1813 6     Pain Loc --      Pain Education --      Exclude from Growth Chart --    No data found.  Updated Vital Signs BP (!) 142/86 (BP Location: Right Arm)   Pulse 79   Temp 98.3 F (36.8 C) (Oral)   Resp 18   SpO2 97%   Visual Acuity Right Eye Distance:   Left Eye Distance:   Bilateral  Distance:    Right Eye Near:   Left Eye Near:    Bilateral Near:     Physical Exam Vitals and nursing note reviewed.  Constitutional:      General: She is not in acute distress.    Appearance: She is well-developed. She is morbidly obese. She is not ill-appearing, toxic-appearing or diaphoretic.  HENT:     Head: Normocephalic and atraumatic.     Right Ear: Hearing, tympanic membrane, ear canal and external ear normal.     Left Ear: Hearing, tympanic membrane, ear canal and external ear normal.     Nose: Congestion and rhinorrhea present. Rhinorrhea is clear.     Right Sinus: No maxillary sinus tenderness or frontal sinus tenderness.     Left Sinus: No maxillary sinus tenderness or frontal sinus tenderness.     Mouth/Throat:     Lips: Pink.     Mouth: Mucous membranes are moist.     Dentition: Abnormal dentition. Dental tenderness, dental caries and dental abscesses (Teeth #14, 15 and 32 all have purulent crusting at the gumline with erythema and significant tenderness.) present.     Pharynx: Uvula midline. No oropharyngeal exudate or posterior oropharyngeal erythema.     Tonsils: No tonsillar exudate.   Eyes:     Conjunctiva/sclera: Conjunctivae normal.     Pupils: Pupils are equal, round, and reactive to light.  Cardiovascular:     Rate and Rhythm: Normal rate and regular rhythm.     Heart sounds: S1 normal and S2 normal. No murmur heard. Pulmonary:     Effort: Pulmonary effort is normal. No respiratory distress.     Breath sounds: Normal breath sounds. No decreased breath sounds, wheezing, rhonchi or rales.  Abdominal:     General: Bowel sounds are normal.     Palpations: Abdomen is soft.     Tenderness: There is no abdominal tenderness.  Musculoskeletal:        General: No swelling.     Cervical back: Neck supple.  Lymphadenopathy:     Head:     Right side of head: Tonsillar adenopathy present. No submental, submandibular, preauricular or posterior auricular adenopathy.      Left side of head: Tonsillar adenopathy present. No submental, submandibular, preauricular or posterior auricular adenopathy.     Cervical: Cervical adenopathy present.     Right cervical: Superficial cervical adenopathy present.     Left cervical: Superficial cervical adenopathy present.  Skin:    General: Skin is warm and dry.  Capillary Refill: Capillary refill takes less than 2 seconds.     Findings: No rash.  Neurological:     Mental Status: She is alert and oriented to person, place, and time.  Psychiatric:        Mood and Affect: Mood normal.      UC Treatments / Results  Labs (all labs ordered are listed, but only abnormal results are displayed) Labs Reviewed - No data to display  EKG   Radiology No results found.  Procedures Procedures (including critical care time)  Medications Ordered in UC Medications - No data to display  Initial Impression / Assessment and Plan / UC Course  I have reviewed the triage vital signs and the nursing notes.  Pertinent labs & imaging results that were available during my care of the patient were reviewed by me and considered in my medical decision making (see chart for details).  Plan of Care (see discharge instructions for additional patient precautions and education): Dental abscess: Many teeth look diseased and have gum inflammation and crusting.  Augmentin  875-125 mg, 1 pill twice daily for 10 days.  Salt water gargles are helpful.  Encouraged to make an appointment with dentist for follow-up and management of dental abscess or carious teeth.  Rhinitis and right ear pain: Ear exam was totally normal.  No sign of sinusitis.  I believe the ear pain is secondary to dental pain.  Use cetirizine , 10 mg daily for nasal congestion.  Follow-up with primary care, dentist or return here if symptoms do not improve, worsen or new symptoms occur.  I reviewed the plan of care with the patient and/or the patient's guardian.  The  patient and/or guardian had time to ask questions and acknowledged that the questions were answered.  Final Clinical Impressions(s) / UC Diagnoses   Final diagnoses:  Right ear pain  Dental abscess  Other rhinitis     Discharge Instructions      Dental abscess: Many teeth look diseased and have gum inflammation and crusting.  Augmentin  875-125 mg, 1 pill twice daily for 10 days.  Salt water gargles are helpful.  Encouraged to make an appointment with dentist for follow-up and management of dental abscess or carious teeth.  Rhinitis and right ear pain: Ear exam was totally normal.  No sign of sinusitis.  I believe the ear pain is secondary to dental pain.  Use cetirizine , 10 mg daily for nasal congestion.  Follow-up with primary care, dentist or return here if symptoms do not improve, worsen or new symptoms occur.     ED Prescriptions     Medication Sig Dispense Auth. Provider   amoxicillin -clavulanate (AUGMENTIN ) 875-125 MG tablet Take 1 tablet by mouth 2 (two) times daily after a meal for 10 days. 20 tablet Nimrod Wendt, FNP   cetirizine  (ZYRTEC ) 10 MG tablet Take 1 tablet (10 mg total) by mouth daily as needed for rhinitis (or nasal congestion). 30 tablet Aracelis Ulrey, FNP      PDMP not reviewed this encounter.    Ival Domino, FNP 01/04/25 1844     [1]  Social History Tobacco Use   Smoking status: Never   Smokeless tobacco: Never  Vaping Use   Vaping status: Never Used  Substance Use Topics   Alcohol use: Never   Drug use: Never     Ival Domino, FNP 01/04/25 1844  "

## 2025-01-04 NOTE — ED Triage Notes (Addendum)
 Pt reports week  3 of runny nose and dry cough. Pt has tried Mucinex and trying Vicks now. Pt reports right ear pain with occasional dizziness

## 2025-01-04 NOTE — Discharge Instructions (Addendum)
 Dental abscess: Many teeth look diseased and have gum inflammation and crusting.  Augmentin  875-125 mg, 1 pill twice daily for 10 days.  Salt water gargles are helpful.  Encouraged to make an appointment with dentist for follow-up and management of dental abscess or carious teeth.  Rhinitis and right ear pain: Ear exam was totally normal.  No sign of sinusitis.  I believe the ear pain is secondary to dental pain.  Use cetirizine , 10 mg daily for nasal congestion.  Follow-up with primary care, dentist or return here if symptoms do not improve, worsen or new symptoms occur.

## 2025-01-10 ENCOUNTER — Encounter
